# Patient Record
Sex: Female | Born: 1995
Health system: Southern US, Community
[De-identification: ages and names within clinical notes are randomized; demographics above are authoritative.]

## PROBLEM LIST (undated history)

## (undated) ENCOUNTER — Inpatient Hospital Stay (HOSPITAL_COMMUNITY): Payer: Self-pay

## (undated) DIAGNOSIS — K831 Obstruction of bile duct: Secondary | ICD-10-CM

## (undated) DIAGNOSIS — R51 Headache: Secondary | ICD-10-CM

## (undated) DIAGNOSIS — O26643 Intrahepatic cholestasis of pregnancy, third trimester: Secondary | ICD-10-CM

## (undated) DIAGNOSIS — R519 Headache, unspecified: Secondary | ICD-10-CM

## (undated) DIAGNOSIS — R569 Unspecified convulsions: Secondary | ICD-10-CM

## (undated) DIAGNOSIS — O26613 Liver and biliary tract disorders in pregnancy, third trimester: Secondary | ICD-10-CM

## (undated) DIAGNOSIS — D649 Anemia, unspecified: Secondary | ICD-10-CM

## (undated) HISTORY — DX: Unspecified convulsions: R56.9

## (undated) HISTORY — DX: Obstruction of bile duct: O26.613

## (undated) HISTORY — DX: Intrahepatic cholestasis of pregnancy, third trimester: O26.643

## (undated) HISTORY — DX: Obstruction of bile duct: K83.1

---
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
## 2014-02-15 ENCOUNTER — Encounter (HOSPITAL_COMMUNITY): Payer: Self-pay | Admitting: Emergency Medicine

## 2015-04-17 NOTE — L&D Delivery Note (Signed)
Pt precipitously delivered a viable female infant in bed. Fellow Genevie Ann arrived shortly afterwards, clamped and cut the cord and I arrived later and took over. Baby had vigorous cry and was on mothers abdomen. I obtained cord blood and spontaneously delivered placenta in shultz and with 3vc.  Vaginal inspection revealed abrasions only - no lacs; hemostatic. No repair needed.  Mother and baby stable; weight pending; Apgars of 9 and 9

## 2015-05-23 LAB — OB RESULTS CONSOLE HEPATITIS B SURFACE ANTIGEN: HEP B S AG: NEGATIVE

## 2015-05-23 LAB — OB RESULTS CONSOLE HIV ANTIBODY (ROUTINE TESTING): HIV: NONREACTIVE

## 2015-05-23 LAB — OB RESULTS CONSOLE RUBELLA ANTIBODY, IGM: RUBELLA: IMMUNE

## 2015-05-23 LAB — OB RESULTS CONSOLE ABO/RH: RH Type: POSITIVE

## 2015-05-23 LAB — OB RESULTS CONSOLE GC/CHLAMYDIA
CHLAMYDIA, DNA PROBE: NEGATIVE
GC PROBE AMP, GENITAL: NEGATIVE

## 2015-05-23 LAB — OB RESULTS CONSOLE ANTIBODY SCREEN: ANTIBODY SCREEN: NEGATIVE

## 2015-05-23 LAB — OB RESULTS CONSOLE RPR: RPR: NONREACTIVE

## 2015-05-31 ENCOUNTER — Inpatient Hospital Stay (HOSPITAL_COMMUNITY)
Admission: AD | Admit: 2015-05-31 | Payer: Medicaid Other | Source: Ambulatory Visit | Admitting: Obstetrics and Gynecology

## 2015-11-01 ENCOUNTER — Encounter (HOSPITAL_COMMUNITY): Payer: Self-pay | Admitting: *Deleted

## 2015-11-01 ENCOUNTER — Inpatient Hospital Stay (HOSPITAL_COMMUNITY)
Admission: AD | Admit: 2015-11-01 | Discharge: 2015-11-01 | Disposition: A | Discharge status: Home / Self Care | Payer: BLUE CROSS/BLUE SHIELD | Source: Ambulatory Visit | Attending: Obstetrics and Gynecology | Admitting: Obstetrics and Gynecology

## 2015-11-01 DIAGNOSIS — O26613 Liver and biliary tract disorders in pregnancy, third trimester: Secondary | ICD-10-CM | POA: Diagnosis not present

## 2015-11-01 DIAGNOSIS — K831 Obstruction of bile duct: Secondary | ICD-10-CM | POA: Diagnosis not present

## 2015-11-01 DIAGNOSIS — O26893 Other specified pregnancy related conditions, third trimester: Secondary | ICD-10-CM | POA: Diagnosis present

## 2015-11-01 DIAGNOSIS — Z3A34 34 weeks gestation of pregnancy: Secondary | ICD-10-CM | POA: Insufficient documentation

## 2015-11-01 DIAGNOSIS — O26643 Intrahepatic cholestasis of pregnancy, third trimester: Secondary | ICD-10-CM

## 2015-11-01 MED ORDER — BETAMETHASONE SOD PHOS & ACET 6 (3-3) MG/ML IJ SUSP
12.0000 mg | Freq: Once | INTRAMUSCULAR | Status: AC
  Administered 2015-11-01: 12 mg via INTRAMUSCULAR
  Filled 2015-11-01: qty 2

## 2015-11-01 NOTE — MAU Note (Signed)
Pt here for BMZ injection, Dr. Mindi SlickerBanga putting in order.

## 2015-11-02 ENCOUNTER — Inpatient Hospital Stay (HOSPITAL_COMMUNITY)
Admission: AD | Admit: 2015-11-02 | Discharge: 2015-11-02 | Disposition: A | Discharge status: Home / Self Care | Payer: BLUE CROSS/BLUE SHIELD | Source: Ambulatory Visit | Attending: Obstetrics and Gynecology | Admitting: Obstetrics and Gynecology

## 2015-11-02 DIAGNOSIS — Z3A00 Weeks of gestation of pregnancy not specified: Secondary | ICD-10-CM | POA: Insufficient documentation

## 2015-11-02 DIAGNOSIS — O26619 Liver and biliary tract disorders in pregnancy, unspecified trimester: Secondary | ICD-10-CM | POA: Diagnosis not present

## 2015-11-02 MED ORDER — BETAMETHASONE SOD PHOS & ACET 6 (3-3) MG/ML IJ SUSP
12.0000 mg | Freq: Once | INTRAMUSCULAR | Status: AC
  Administered 2015-11-02: 12 mg via INTRAMUSCULAR
  Filled 2015-11-02: qty 2

## 2015-11-02 NOTE — MAU Note (Signed)
Pt presents to MAU for repeat betamethasone injection. Denies any vaginal bleeding or LOF

## 2015-11-11 ENCOUNTER — Encounter (HOSPITAL_COMMUNITY): Payer: Self-pay | Admitting: *Deleted

## 2015-11-11 ENCOUNTER — Telehealth (HOSPITAL_COMMUNITY): Payer: Self-pay | Admitting: *Deleted

## 2015-11-11 NOTE — Telephone Encounter (Signed)
Preadmission screen  

## 2015-11-18 ENCOUNTER — Inpatient Hospital Stay (HOSPITAL_COMMUNITY)
Admission: RE | Admit: 2015-11-18 | Discharge: 2015-11-20 | DRG: 775 | Disposition: A | Discharge status: Home / Self Care | Payer: BLUE CROSS/BLUE SHIELD | Source: Ambulatory Visit | Attending: Obstetrics and Gynecology | Admitting: Obstetrics and Gynecology

## 2015-11-18 ENCOUNTER — Encounter (HOSPITAL_COMMUNITY): Payer: Self-pay

## 2015-11-18 DIAGNOSIS — O26619 Liver and biliary tract disorders in pregnancy, unspecified trimester: Secondary | ICD-10-CM

## 2015-11-18 DIAGNOSIS — O2662 Liver and biliary tract disorders in childbirth: Principal | ICD-10-CM | POA: Diagnosis present

## 2015-11-18 DIAGNOSIS — O26649 Intrahepatic cholestasis of pregnancy, unspecified trimester: Secondary | ICD-10-CM | POA: Diagnosis present

## 2015-11-18 DIAGNOSIS — O9902 Anemia complicating childbirth: Secondary | ICD-10-CM | POA: Diagnosis present

## 2015-11-18 DIAGNOSIS — Z3A36 36 weeks gestation of pregnancy: Secondary | ICD-10-CM | POA: Diagnosis not present

## 2015-11-18 DIAGNOSIS — K831 Obstruction of bile duct: Secondary | ICD-10-CM | POA: Diagnosis present

## 2015-11-18 DIAGNOSIS — Z8249 Family history of ischemic heart disease and other diseases of the circulatory system: Secondary | ICD-10-CM | POA: Diagnosis not present

## 2015-11-18 DIAGNOSIS — O365930 Maternal care for other known or suspected poor fetal growth, third trimester, not applicable or unspecified: Secondary | ICD-10-CM | POA: Diagnosis present

## 2015-11-18 DIAGNOSIS — D649 Anemia, unspecified: Secondary | ICD-10-CM | POA: Diagnosis present

## 2015-11-18 HISTORY — DX: Headache: R51

## 2015-11-18 HISTORY — DX: Anemia, unspecified: D64.9

## 2015-11-18 HISTORY — DX: Headache, unspecified: R51.9

## 2015-11-18 LAB — CBC
HEMATOCRIT: 29.4 % — AB (ref 36.0–46.0)
HEMOGLOBIN: 9 g/dL — AB (ref 12.0–15.0)
MCH: 22.1 pg — AB (ref 26.0–34.0)
MCHC: 30.6 g/dL (ref 30.0–36.0)
MCV: 72.2 fL — ABNORMAL LOW (ref 78.0–100.0)
Platelets: 228 10*3/uL (ref 150–400)
RBC: 4.07 MIL/uL (ref 3.87–5.11)
RDW: 17.1 % — ABNORMAL HIGH (ref 11.5–15.5)
WBC: 9.2 10*3/uL (ref 4.0–10.5)

## 2015-11-18 LAB — OB RESULTS CONSOLE GBS: STREP GROUP B AG: NEGATIVE

## 2015-11-18 LAB — TYPE AND SCREEN
ABO/RH(D): O POS
ANTIBODY SCREEN: NEGATIVE

## 2015-11-18 LAB — RPR: RPR Ser Ql: NONREACTIVE

## 2015-11-18 LAB — GROUP B STREP BY PCR: GROUP B STREP BY PCR: NEGATIVE

## 2015-11-18 MED ORDER — LIDOCAINE HCL (PF) 1 % IJ SOLN
30.0000 mL | INTRAMUSCULAR | Status: DC | PRN
  Filled 2015-11-18: qty 30

## 2015-11-18 MED ORDER — OXYTOCIN BOLUS FROM INFUSION
500.0000 mL | Freq: Once | INTRAVENOUS | Status: AC
  Administered 2015-11-19: 500 mL via INTRAVENOUS

## 2015-11-18 MED ORDER — PENICILLIN G POTASSIUM 5000000 UNITS IJ SOLR
5.0000 10*6.[IU] | Freq: Once | INTRAMUSCULAR | Status: AC
  Administered 2015-11-18: 5 10*6.[IU] via INTRAVENOUS
  Filled 2015-11-18: qty 5

## 2015-11-18 MED ORDER — OXYCODONE-ACETAMINOPHEN 5-325 MG PO TABS: 1.0000 | ORAL_TABLET | ORAL | Status: DC | PRN

## 2015-11-18 MED ORDER — ONDANSETRON HCL 4 MG/2ML IJ SOLN: 4.0000 mg | Freq: Four times a day (QID) | INTRAMUSCULAR | Status: DC | PRN

## 2015-11-18 MED ORDER — OXYTOCIN 40 UNITS IN LACTATED RINGERS INFUSION - SIMPLE MED: 2.5000 [IU]/h | INTRAVENOUS | Status: DC

## 2015-11-18 MED ORDER — TERBUTALINE SULFATE 1 MG/ML IJ SOLN
0.2500 mg | Freq: Once | INTRAMUSCULAR | Status: DC | PRN
  Filled 2015-11-18: qty 1

## 2015-11-18 MED ORDER — OXYCODONE-ACETAMINOPHEN 5-325 MG PO TABS: 2.0000 | ORAL_TABLET | ORAL | Status: DC | PRN

## 2015-11-18 MED ORDER — OXYTOCIN 40 UNITS IN LACTATED RINGERS INFUSION - SIMPLE MED
1.0000 m[IU]/min | INTRAVENOUS | Status: DC
Start: 2015-11-18 — End: 2015-11-19
  Administered 2015-11-18: 2 m[IU]/min via INTRAVENOUS
  Filled 2015-11-18: qty 1000

## 2015-11-18 MED ORDER — LACTATED RINGERS IV SOLN
INTRAVENOUS | Status: DC
  Administered 2015-11-18 (×2): via INTRAVENOUS

## 2015-11-18 MED ORDER — LACTATED RINGERS IV SOLN: 500.0000 mL | INTRAVENOUS | Status: DC | PRN

## 2015-11-18 MED ORDER — ACETAMINOPHEN 325 MG PO TABS: 650.0000 mg | ORAL_TABLET | ORAL | Status: DC | PRN

## 2015-11-18 MED ORDER — DEXTROSE 5 % IV SOLN
2.5000 10*6.[IU] | INTRAVENOUS | Status: DC
  Administered 2015-11-18: 2.5 10*6.[IU] via INTRAVENOUS
  Filled 2015-11-18 (×7): qty 2.5

## 2015-11-18 MED ORDER — BUTORPHANOL TARTRATE 1 MG/ML IJ SOLN
1.0000 mg | INTRAMUSCULAR | Status: DC
  Administered 2015-11-18 (×2): 1 mg via INTRAVENOUS
  Filled 2015-11-18 (×3): qty 1

## 2015-11-18 MED ORDER — SOD CITRATE-CITRIC ACID 500-334 MG/5ML PO SOLN: 30.0000 mL | ORAL | Status: DC | PRN

## 2015-11-18 NOTE — H&P (Addendum)
Brandy Sanders is a 20 y.o. female presenting for scheduled iol for cholestasis of pregnancy. Pt had this with last pregnancy as well.  Her prenatal course begun in first trimester with dating based on 10 week [place]. She has a remote hx of seizures - none during pregnancy; last episode was at age 27. Had fainting spells with last pregnancy - none with this. At [redacted] weeks gestation pt begun complaining of itching. Subsequently she was noted to have cholestasis of pregnancy. Pt has been managed with steroid course, twice weekly NSTs and serial labwork. She was started on ursodiol and dose increased to manageable limit. At last appt a week ago, baby was noted to be measuring small - [place] confirmed 14th percentile - mostly due to [facility] measurement in 2.3%.  . OB History    Gravida Para Term Preterm AB Living   0 0 1   SAB TAB Ectopic Multiple Live Births   0 0 0 0 1     Past Medical History:  Diagnosis Date  . Anemia    Takes Iron supplement  . Cholestasis of pregnancy in third trimester   . Headache    relief with Tylenol  . Seizures (HCC) 2008   Had seizure in 3rd month of first pregnancy in 2013   Past Surgical History:  Procedure Laterality Date  . NO PAST SURGERIES    . WISDOM TOOTH EXTRACTION  2014   Family History: family history includes Hypertension in her mother. Social History:  reports that she has never smoked. She has never used smokeless tobacco. She reports that she does not drink alcohol or use drugs.     Maternal Diabetes: No Genetic Screening: Normal Maternal Ultrasounds/Referrals: Normal SGA Fetal Ultrasounds or other Referrals:  None Maternal Substance Abuse:  No Significant Maternal Medications:  Meds include: Other: ursodiol Significant Maternal Lab Results:  Lab values include: Other: GBS pending Other Comments:  None  Review of Systems  Constitutional: Positive for malaise/fatigue. Negative for chills, fever and weight loss.  Eyes: Negative for blurred  vision.  Respiratory: Negative for shortness of breath.   Cardiovascular: Negative for chest pain and palpitations.  Gastrointestinal: Negative for abdominal pain, heartburn and nausea.  Genitourinary: Negative for dysuria.  Musculoskeletal: Negative for back pain.  Skin: Positive for itching and rash.  Neurological: Negative for headaches.  Psychiatric/Behavioral: Negative for depression, hallucinations, substance abuse and suicidal ideas. The patient is not nervous/anxious.    Maternal Medical History:  Reason for admission: Nausea. iol for cholestasis of pregnancy  Contractions: Frequency: rare.    Fetal activity: Perceived fetal activity is normal.   Last perceived fetal movement was within the past hour.    Prenatal complications: Cholestasis SGA  Prenatal Complications - Diabetes: none.    Dilation: 2 Effacement (%): 70, 80 Station: -2 Exam by:: Brandy Slipper, RN Blood pressure 112/68, pulse 74, temperature 97.6 F (36.4 C), temperature source Oral, resp. rate 20, height 5' (1.524 m), weight 147 lb (66.7 kg). Maternal Exam:  Uterine Assessment: Contraction strength is mild.  Contraction frequency is irregular.   Abdomen: Patient reports generalized tenderness.  Estimated fetal weight is 5lbs 1oz as of last week per [place].   Fetal presentation: vertex  Introitus: Normal vulva. Normal vagina.  Pelvis: adequate for delivery.   Cervix: Cervix evaluated by digital exam.     Physical Exam  Constitutional: She is oriented to person, place, and time. She appears well-developed and well-nourished.  Neck: Normal range of motion.  Cardiovascular: Normal rate.   Respiratory: Effort normal.  GI: Soft. There is generalized tenderness.  Genitourinary: Vagina normal and uterus normal.  Musculoskeletal: Normal range of motion. She exhibits no edema.  Neurological: She is alert and oriented to person, place, and time.  Skin: Skin is warm. Rash noted.  Psychiatric: She has a normal  mood and affect. Her behavior is normal. Judgment and thought content normal.    Prenatal labs: ABO, Rh: --/--/O POS (08/04 2355) Antibody: NEG (08/04 7322) Rubella: Immune (02/06 0000) RPR: Nonreactive (02/06 0000)  HBsAg: Negative (02/06 0000)  HIV: Non-reactive (02/06 0000)  GBS:     Assessment/Plan: G2P1001 at 22 3/[redacted]wks gestation with cholestasis of pregnancy for IOL Pitocin per protocol AROM after first dose of PCN; GBS result not found ( was done in office) - rapid result pending Pain control prn Anticipate svd   Brandy Sanders 11/18/2015, 9:12 AM

## 2015-11-18 NOTE — Progress Notes (Signed)
Patient ID: Brandy Sanders, female   DOB: Dec 05, 1995, 20 y.o.   MRN: [id number] Pt on of pitocin - rates contractions at 5/10. No complaints. +fms; Clear fluid VSS EFM- 145, +accels, -decels, moderate variability, cat 1 TOCO - contractions q 1-9mins SVE 4/85/-2  A/P: G2P1001 at 36 3/7wks progressing in labor         Continue with expectant management         Anticipate svd

## 2015-11-18 NOTE — Anesthesia Pain Management Evaluation Note (Signed)
  CRNA Pain Management Visit Note  Patient: Brandy Sanders, 20 y.o., female  "Hello I am a member of the anesthesia team at [facility]. We have an anesthesia team available at all times to provide care throughout the hospital, including epidural management and anesthesia for C-section. I don't know your plan for the delivery whether it a natural birth, water birth, IV sedation, nitrous supplementation, doula or epidural, but we want to meet your pain goals."   1.Was your pain managed to your expectations on prior hospitalizations?   Yes   2.What is your expectation for pain management during this hospitalization?     Labor support without medications  3.How can we help you reach that goal? labor support  Record the patient's initial score and the patient's pain goal.   Pain: 0  Pain Goal: 8 [facility] wants you to be able to say your pain was always managed very well.  Salome Arnt 11/18/2015

## 2015-11-18 NOTE — Progress Notes (Signed)
Patient ID: Brandy Sanders, female   DOB: Aug 24, 1995, 20 y.o.   MRN: [id number] Pt still rates contractions at 5/10 VSS EFM 135, +accels, -decels, moderate variability, cat 1 TOCO contractions irregular - walking monitor SVE 4.5/90/-2   A/P: Continue with expectant management         Anticipate svd

## 2015-11-18 NOTE — Progress Notes (Signed)
Patient ID: Brandy Sanders, female   DOB: 11-17-1995, 20 y.o.   MRN: [id number] Attempted AROM Will try again with lower station Pt doing well - plans to have pain free delivery  VSS EFM - 140, +accels, -decels, moderate variability, cat 1 TOCO - irreg ctxs q 3-31mins SVE - 1.5/70/-2  A/P: Pit at - continue per protocol         S/p 1 dose PCN for GBS prophylaxis         AROM when able         Anticipate svd

## 2015-11-18 NOTE — Progress Notes (Signed)
Patient ID: Brandy Sanders, female   DOB: 1995-05-29, 20 y.o.   MRN: [id number] Pt reports increasing pain with contractions. Appears AROM was successful - clear fluid return shortly after attempt earlier was noted. _Fms VSS EFM - 140s, +accels, -decels, moderate variability; cat 1 TOCO - contractions q 3-75mins SVE 3/75/-2, ant  A/P:G2P1001 with cholestasis at 36 3/7wks progressing in labor          Pit at - continue per protocol         Anticipate svd

## 2015-11-19 ENCOUNTER — Encounter (HOSPITAL_COMMUNITY): Payer: Self-pay | Admitting: Obstetrics and Gynecology

## 2015-11-19 LAB — CBC
HEMATOCRIT: 24.7 % — AB (ref 36.0–46.0)
Hemoglobin: 7.8 g/dL — ABNORMAL LOW (ref 12.0–15.0)
MCH: 22.7 pg — AB (ref 26.0–34.0)
MCHC: 31.6 g/dL (ref 30.0–36.0)
MCV: 71.8 fL — AB (ref 78.0–100.0)
PLATELETS: 177 10*3/uL (ref 150–400)
RBC: 3.44 MIL/uL — ABNORMAL LOW (ref 3.87–5.11)
RDW: 17.3 % — AB (ref 11.5–15.5)
WBC: 11.1 10*3/uL — AB (ref 4.0–10.5)

## 2015-11-19 MED ORDER — ONDANSETRON HCL 4 MG/2ML IJ SOLN: 4.0000 mg | INTRAMUSCULAR | Status: DC | PRN

## 2015-11-19 MED ORDER — SIMETHICONE 80 MG PO CHEW
80.0000 mg | CHEWABLE_TABLET | ORAL | Status: DC | PRN
Start: 2015-11-19 — End: 2015-11-20

## 2015-11-19 MED ORDER — ZOLPIDEM TARTRATE 5 MG PO TABS
5.0000 mg | ORAL_TABLET | Freq: Every evening | ORAL | Status: DC | PRN
Start: 2015-11-19 — End: 2015-11-20

## 2015-11-19 MED ORDER — SENNOSIDES-DOCUSATE SODIUM 8.6-50 MG PO TABS
2.0000 | ORAL_TABLET | ORAL | Status: DC
  Administered 2015-11-19: 2 via ORAL
  Filled 2015-11-19: qty 2

## 2015-11-19 MED ORDER — TETANUS-DIPHTH-ACELL PERTUSSIS 5-2.5-18.5 LF-MCG/0.5 IM SUSP: 0.5000 mL | Freq: Once | INTRAMUSCULAR | Status: DC

## 2015-11-19 MED ORDER — WITCH HAZEL-GLYCERIN EX PADS: 1.0000 "application " | MEDICATED_PAD | CUTANEOUS | Status: DC | PRN

## 2015-11-19 MED ORDER — ACETAMINOPHEN 325 MG PO TABS: 650.0000 mg | ORAL_TABLET | ORAL | Status: DC | PRN

## 2015-11-19 MED ORDER — COCONUT OIL OIL: 1.0000 "application " | TOPICAL_OIL | Status: DC | PRN

## 2015-11-19 MED ORDER — DIPHENHYDRAMINE HCL 25 MG PO CAPS: 25.0000 mg | ORAL_CAPSULE | Freq: Four times a day (QID) | ORAL | Status: DC | PRN

## 2015-11-19 MED ORDER — DIBUCAINE 1 % RE OINT: 1.0000 "application " | TOPICAL_OINTMENT | RECTAL | Status: DC | PRN

## 2015-11-19 MED ORDER — ONDANSETRON HCL 4 MG PO TABS: 4.0000 mg | ORAL_TABLET | ORAL | Status: DC | PRN

## 2015-11-19 MED ORDER — FERROUS SULFATE 325 (65 FE) MG PO TABS
325.0000 mg | ORAL_TABLET | Freq: Two times a day (BID) | ORAL | Status: DC
  Administered 2015-11-19: 325 mg via ORAL
  Filled 2015-11-19: qty 1

## 2015-11-19 MED ORDER — BENZOCAINE-MENTHOL 20-0.5 % EX AERO: 1.0000 "application " | INHALATION_SPRAY | CUTANEOUS | Status: DC | PRN

## 2015-11-19 MED ORDER — IBUPROFEN 600 MG PO TABS
600.0000 mg | ORAL_TABLET | Freq: Four times a day (QID) | ORAL | Status: DC
  Administered 2015-11-19 – 2015-11-20 (×5): 600 mg via ORAL
  Filled 2015-11-19 (×6): qty 1

## 2015-11-19 MED ORDER — PRENATAL MULTIVITAMIN CH
1.0000 | ORAL_TABLET | Freq: Every day | ORAL | Status: DC
  Administered 2015-11-19: 1 via ORAL
  Filled 2015-11-19: qty 1

## 2015-11-19 NOTE — Lactation Note (Addendum)
This note was copied from a baby's chart. Lactation Consultation Note Initial consult with this 5 pound LPI, now 12 hours old. The baby has been feeding every 2 hours,. Mom puts baby to breast first, and then he has been tolerating 15 ml's of formula, by bottle ,is pumping at least every 3 hours, to protect her milk supply and provide EBm for Chrissie Noa. Mom was decreased to 21 flanges with a good fit. Mom had some colostrum expressed as soon as pumping began. Mom says she has an electric pump at home. She will ask her family member if it is a double pump. LPI protocol reviewed with mom. Mom keeping baby skin to skin, and doing very well with this baby. Mom's first was also a LPI. Mom knows to call for questions/concerns.   Patient Name: Boy Allure Igoe EHMCN'O Date: 11/19/2015 Reason for consult: Initial assessment   Maternal Data Formula Feeding for Exclusion: Yes (infant 5 pounds and LPI, will need formula for supplemetn initially) Has patient been taught Hand Expression?: Yes Does the patient have breastfeeding experience prior to this delivery?: Yes  Feeding Feeding Type: Breast Fed  LATCH Score/Interventions Latch: Too sleepy or reluctant, no latch achieved, no sucking elicited.  Audible Swallowing: None (easily expressed colostrum)  Type of Nipple: Everted at rest and after stimulation  Comfort (Breast/Nipple): Soft / non-tender     Hold (Positioning): Assistance needed to correctly position infant at breast and maintain latch. Intervention(s): Breastfeeding basics reviewed;Support Pillows;Position options;Skin to skin  LATCH Score: 5  Lactation Tools Discussed/Used Tools: Pump Breast pump type: Double-Electric Breast Pump Pump Review: Setup, frequency, and cleaning;Milk Storage;Other (comment) (hand expression pump setting) Initiated by:: bedside RN Date initiated:: 11/19/15   Consult Status Consult Status: Follow-up Date: 11/20/15 Follow-up type:  In-patient    Alfred Levins 11/19/2015, 1:09 PM

## 2015-11-19 NOTE — Progress Notes (Signed)
Called to bedside for delivery. When I arrived baby had been delivered. Cord was clamped x2 and cut. Dr. Mindi Slicker arrived and completed with delivery.   Ernestina Penna MD

## 2015-11-20 MED ORDER — IBUPROFEN 600 MG PO TABS: 600.0000 mg | ORAL_TABLET | Freq: Four times a day (QID) | ORAL | 0 refills | Status: DC

## 2015-11-20 MED ORDER — FERROUS SULFATE 325 (65 FE) MG PO TABS: 325.0000 mg | ORAL_TABLET | Freq: Two times a day (BID) | ORAL | 2 refills | Status: DC

## 2015-11-20 NOTE — Progress Notes (Signed)
Patient ID: Brandy Sanders, female   DOB: 11/19/1995, 20 y.o.   MRN: [id number] Pt doing well. Sore but tolerable. Lochia mild. Ambulating and tolerating diet well. No problems with voids. Baby latched well this am. Requests discharge to home today VSS ABD- soft, ND EXT - no homans, no edema  A/ P: [id number] female on PPD#1 s/p svd; had cholestasis  - itching improved          Reviewed discharge instructions- rx for ibuprofen and iron          Does not desire bc or circumcision          F/u in 6weeks

## 2015-11-20 NOTE — Discharge Instructions (Signed)
Nothing in vagina for 6 weeks.  No sex, tampons, and douching.  Other instructions as in [facility] Discharge Booklet. °

## 2015-11-20 NOTE — Discharge Summary (Signed)
OB Discharge Summary     Patient Name: Brandy MorganJasmine Edberg DOB: 02/22/1996 MRN: [id number]  Date of admission: 11/18/2015 Delivering MD: Pryor OchoaBANGA, Melinda [facility]   Date of discharge: 11/20/2015  Admitting diagnosis: INDUCTION Intrauterine pregnancy: Unknown     Secondary diagnosis:  Active Problems:   Cholestasis of pregnancy   SVD (spontaneous vaginal delivery)   Postpartum care following vaginal delivery  Additional problems: anemia     Discharge diagnosis: Preterm Pregnancy Delivered                                                                                                Post partum procedures:none  Augmentation: AROM and Pitocin  Complications: None  Hospital course:  Induction of Labor With Vaginal Delivery   20 y.o. yo [id number] at Unknown was admitted to the hospital 11/18/2015 for induction of labor.  Indication for induction: Cholestasis of pregnancy.  Patient had an uncomplicated labor course as follows: Membrane Rupture Time/Date: 10:36 AM ,11/18/2015   Intrapartum Procedures: Episiotomy: None [1]                                         Lacerations:  None [1]  Patient had delivery of a Viable infant.  Information for the patient's newborn:  Haskell RilingMedelEnriquez, Boy Viviene [[id number]][[id number]]  Delivery Method: Vag-Spont   11/19/2015  Details of delivery can be found in separate delivery note.  Patient had a routine postpartum course. Patient is discharged home 11/20/15.   Physical exam Vitals:   11/19/15 0415 11/19/15 0815 11/19/15 1800 11/20/15 0554  BP: 104/65 (!) 103/57 (!) 104/51 90/77  Pulse: 73 74 89 85  Resp: 20 18 19 16   Temp: 98.3 F (36.8 C) 98.9 F (37.2 C) 98.8 F (37.1 C) 98 F (36.7 C)  TempSrc:      Weight:      Height:       General: alert, cooperative and no distress Lochia: appropriate Uterine Fundus: firm Incision: N/A DVT Evaluation: No evidence of DVT seen on physical exam. No significant calf/ankle edema. Labs: Lab Results  Component Value  Date   WBC 11.1 (H) 11/19/2015   HGB 7.8 (L) 11/19/2015   HCT 24.7 (L) 11/19/2015   MCV 71.8 (L) 11/19/2015   PLT 177 11/19/2015   CMP Latest Ref Rng & Units 02/04/2013  Glucose 70 - 99 mg/dL 70  BUN 6 - 23 mg/dL 9  Creatinine 3.240.50 - 4.011.10 mg/dL 0.270.80  Sodium 253135 - 664145 mEq/L 143  Potassium 3.5 - 5.1 mEq/L 3.5  Chloride 96 - 112 mEq/L 108  CO2 19 - 32 mEq/L -  Calcium 8.4 - 10.5 mg/dL -  Total Protein 6.0 - 8.3 g/dL -  Total Bilirubin 0.3 - 1.2 mg/dL -  Alkaline Phos 47 - 403119 U/L -  AST 0 - 37 U/L -  ALT 0 - 35 U/L -    Discharge instruction: per After Visit Summary and "Baby and Me Booklet".  After visit meds:    Medication List  STOP taking these medications   ursodiol 300 MG capsule Commonly known as:  ACTIGALL     TAKE these medications   ferrous sulfate 325 (65 FE) MG tablet Take 1 tablet (325 mg total) by mouth 2 (two) times daily with a meal.   ibuprofen 600 MG tablet Commonly known as:  ADVIL,MOTRIN Take 1 tablet (600 mg total) by mouth every 6 (six) hours.       Diet: routine diet  Activity: Advance as tolerated. Pelvic rest for 6 weeks.   Outpatient follow up:6 weeks Follow up Appt:No future appointments. Follow up Visit:No Follow-up on file.  Postpartum contraception: Natural Family Planning  Newborn Data: Live born female  Birth Weight: 5 lb 2.4 oz (2336 g) APGAR: 9, 9  Baby Feeding: Bottle and Breast Disposition:home with mother   11/20/2015 Sharol Given Doniven Vanpatten, DO

## 2015-11-20 NOTE — Lactation Note (Signed)
This note was copied from a baby's chart. Lactation Consultation Note Follow up consult with this mom and LPI, now 34 hours old and 36 5/7 weeks CGam weight 5 lbs 0.4 oz. Mom has been breast feeding and supplementing with formula, but not pumping and hand expressing. Mom has easily expressed colostrum. I advised her to add pumping and HE to protect her milk supply and increase the amount of EBM the baby gets by supplementing with EBM prior to formula. Mom has a DEP at home. She will call WIC to get 22 cal formula. I gave mom some Neosure formula to take home, and reviewed storage guidelines with her . Mom  Knows to call for questions/concerns.  Patient Name: Brandy Sanders ZOXWR'UToday's Date: 11/20/2015 Reason for consult: Follow-up assessment   Maternal Data    Feeding Feeding Type: Bottle Fed - Formula Nipple Type: Slow - flow Length of feed: 15 min  LATCH Score/Interventions    Audible Swallowing:  (easily expressed colostrum)                 Lactation Tools Discussed/Used     Consult Status Consult Status: Complete Follow-up type: Call as needed    Alfred LevinsLee, Deontrey Massi Anne 11/20/2015, 11:24 AM

## 2016-09-16 ENCOUNTER — Emergency Department (HOSPITAL_BASED_OUTPATIENT_CLINIC_OR_DEPARTMENT_OTHER)
Admission: EM | Admit: 2016-09-16 | Discharge: 2016-09-16 | Disposition: A | Discharge status: Home / Self Care | Payer: BLUE CROSS/BLUE SHIELD | Attending: Emergency Medicine | Admitting: Emergency Medicine

## 2016-09-16 ENCOUNTER — Emergency Department (HOSPITAL_COMMUNITY)
Admission: EM | Admit: 2016-09-16 | Discharge: 2016-09-16 | Discharge status: Absent without leave | Payer: BLUE CROSS/BLUE SHIELD

## 2016-09-16 ENCOUNTER — Encounter (HOSPITAL_BASED_OUTPATIENT_CLINIC_OR_DEPARTMENT_OTHER): Payer: Self-pay | Admitting: *Deleted

## 2016-09-16 DIAGNOSIS — B373 Candidiasis of vulva and vagina: Secondary | ICD-10-CM | POA: Insufficient documentation

## 2016-09-16 DIAGNOSIS — B3731 Acute candidiasis of vulva and vagina: Secondary | ICD-10-CM

## 2016-09-16 LAB — URINALYSIS, ROUTINE W REFLEX MICROSCOPIC
BILIRUBIN URINE: NEGATIVE
GLUCOSE, UA: NEGATIVE mg/dL
HGB URINE DIPSTICK: NEGATIVE
KETONES UR: NEGATIVE mg/dL
Nitrite: NEGATIVE
PH: 6.5 (ref 5.0–8.0)
Protein, ur: NEGATIVE mg/dL
Specific Gravity, Urine: 1.011 (ref 1.005–1.030)

## 2016-09-16 LAB — WET PREP, GENITAL
Clue Cells Wet Prep HPF POC: NONE SEEN
Sperm: NONE SEEN
TRICH WET PREP: NONE SEEN

## 2016-09-16 LAB — PREGNANCY, URINE: Preg Test, Ur: NEGATIVE

## 2016-09-16 LAB — URINALYSIS, MICROSCOPIC (REFLEX)

## 2016-09-16 MED ORDER — FLUCONAZOLE 50 MG PO TABS
150.0000 mg | ORAL_TABLET | Freq: Once | ORAL | Status: AC
  Administered 2016-09-16: 06:00:00 150 mg via ORAL
  Filled 2016-09-16: qty 1

## 2016-09-16 NOTE — ED Provider Notes (Signed)
[facility] Provider Note: [name], MD, FACEP  CSN: [id number] MRN: [id number] ARRIVAL: 09/16/16 at 0448 ROOM: MH07/MH07   CHIEF COMPLAINT  Vaginal Discharge   HISTORY OF PRESENT ILLNESS  Brandy Sanders is a 21 y.o. female who developed vaginal itching yesterday with associated white, curd-like discharge. She took an over-the-counter dose of Monistat. She is here this morning because overnight she developed vulvovaginal burning. She cannot quantify the burning. She is also having some mild uterine cramping. She is not having any vaginal bleeding. She is denies dysuria.   Past Medical History:  Diagnosis Date  . Anemia    Takes Iron supplement  . Cholestasis of pregnancy in third trimester   . Headache    relief with Tylenol  . Seizures (HCC) 2008   Had seizure in 3rd month of first pregnancy in 2013    Past Surgical History:  Procedure Laterality Date  . NO PAST SURGERIES    . WISDOM TOOTH EXTRACTION  2014    Family History  Problem Relation Age of Onset  . Hypertension Mother     Social History  Substance Use Topics  . Smoking status: Never Smoker  . Smokeless tobacco: Never Used  . Alcohol use No    Prior to Admission medications   Not on File    Allergies Patient has no known allergies.   REVIEW OF SYSTEMS  Negative except as noted here or in the History of Present Illness.   PHYSICAL EXAMINATION  Initial Vital Signs Blood pressure 123/80, pulse (!) 102, temperature 99 F (37.2 C), temperature source Oral, resp. rate 16, height 5\' 3"  (1.6 m), weight 63.5 kg (140 lb), last menstrual period 09/02/2016, SpO2 100 %, unknown if currently breastfeeding.  Examination General: Well-developed, well-nourished female in no acute distress; appearance consistent with age of record HENT: normocephalic; atraumatic Eyes: pupils equal, round and reactive to light; extraocular muscles intact Neck: supple Heart: regular rate and  rhythm Lungs: clear to auscultation bilaterally Abdomen: soft; nondistended; mild suprapubic tenderness; no masses or hepatosplenomegaly; bowel sounds present GU: Vulvovaginal inflammation with white, curd-like discharge; no vaginal bleeding; mild crampy uterine tenderness Extremities: No deformity; full range of motion; pulses normal Neurologic: Awake, alert and oriented; motor function intact in all extremities and symmetric; no facial droop Skin: Warm and dry Psychiatric: Normal mood and affect   RESULTS  Summary of this visit's results, reviewed by myself:   EKG Interpretation  Date/Time:    Ventricular Rate:    PR Interval:    QRS Duration:   QT Interval:    QTC Calculation:   R Axis:     Text Interpretation:        Laboratory Studies: Results for orders placed or performed during the hospital encounter of 09/16/16 (from the past 24 hour(s))  Urinalysis, Routine w reflex microscopic     Status: Abnormal   Collection Time: 09/16/16  4:54 AM  Result Value Ref Range   Color, Urine YELLOW YELLOW   APPearance TURBID (A) CLEAR   Specific Gravity, Urine 1.011 1.005 - 1.030   pH 6.5 5.0 - 8.0   Glucose, UA NEGATIVE NEGATIVE mg/dL   Hgb urine dipstick NEGATIVE NEGATIVE   Bilirubin Urine NEGATIVE NEGATIVE   Ketones, ur NEGATIVE NEGATIVE mg/dL   Protein, ur NEGATIVE NEGATIVE mg/dL   Nitrite NEGATIVE NEGATIVE   Leukocytes, UA LARGE (A) NEGATIVE  Pregnancy, urine     Status: None   Collection Time: 09/16/16  4:54 AM  Result Value Ref  Range   Preg Test, Ur NEGATIVE NEGATIVE  Urinalysis, Microscopic (reflex)     Status: Abnormal   Collection Time: 09/16/16  4:54 AM  Result Value Ref Range   RBC / HPF 0-5 0 - 5 RBC/hpf   WBC, UA 6-30 0 - 5 WBC/hpf   Bacteria, UA MANY (A) NONE SEEN   Squamous Epithelial / LPF TOO NUMEROUS TO COUNT (A) NONE SEEN  Wet prep, genital     Status: Abnormal   Collection Time: 09/16/16  5:25 AM  Result Value Ref Range   Yeast Wet Prep HPF POC  PRESENT (A) NONE SEEN   Trich, Wet Prep NONE SEEN NONE SEEN   Clue Cells Wet Prep HPF POC NONE SEEN NONE SEEN   WBC, Wet Prep HPF POC MANY (A) NONE SEEN   Sperm NONE SEEN    Imaging Studies: No results found.  ED COURSE  Nursing notes and initial vitals signs, including pulse oximetry, reviewed.  Vitals:   09/16/16 0453  BP: 123/80  Pulse: (!) 102  Resp: 16  Temp: 99 F (37.2 C)  TempSrc: Oral  SpO2: 100%  Weight: 63.5 kg (140 lb)  Height: 5\' 3"  (1.6 m)    PROCEDURES    ED DIAGNOSES     ICD-9-CM ICD-10-CM   1. Vaginal yeast infection 112.1 B37.3        Beauty Pless, MD 09/16/16 [phone]

## 2016-09-16 NOTE — ED Triage Notes (Signed)
Pt c/o vaginal burning that started yesterday. States that she has also had a "white discharge" states that she bought and OTC monistat and took a dose yesterday. Also c/o itching to vaginal area. Denies any urinary symptoms. Also concerned about an STD.

## 2016-09-17 LAB — GC/CHLAMYDIA PROBE AMP (~~LOC~~) NOT AT ARMC
CHLAMYDIA, DNA PROBE: NEGATIVE
Neisseria Gonorrhea: NEGATIVE

## 2016-10-31 ENCOUNTER — Emergency Department (HOSPITAL_BASED_OUTPATIENT_CLINIC_OR_DEPARTMENT_OTHER)
Admission: EM | Admit: 2016-10-31 | Discharge: 2016-10-31 | Disposition: A | Discharge status: Home / Self Care | Payer: Self-pay | Attending: Emergency Medicine | Admitting: Emergency Medicine

## 2016-10-31 ENCOUNTER — Encounter (HOSPITAL_BASED_OUTPATIENT_CLINIC_OR_DEPARTMENT_OTHER): Payer: Self-pay

## 2016-10-31 DIAGNOSIS — R21 Rash and other nonspecific skin eruption: Secondary | ICD-10-CM | POA: Insufficient documentation

## 2016-10-31 MED ORDER — HYDROCORTISONE 2.5 % EX LOTN: TOPICAL_LOTION | Freq: Two times a day (BID) | CUTANEOUS | 0 refills | Status: DC

## 2016-10-31 MED ORDER — TRIAMCINOLONE ACETONIDE 0.1 % EX CREA: 1.0000 "application " | TOPICAL_CREAM | Freq: Two times a day (BID) | CUTANEOUS | 0 refills | Status: DC

## 2016-10-31 NOTE — ED Notes (Signed)
Pt verbalizes understanding of d/c instructions and denies any further needs at this time. 

## 2016-10-31 NOTE — ED Triage Notes (Signed)
C/o rash to chest and neck x 1 month-NAD-steady gait

## 2016-10-31 NOTE — ED Provider Notes (Signed)
[facility] Provider Note   CSN: [id number] Arrival date & time: 10/31/16  2108   By signing my name below, I, [name], attest that this documentation has been prepared under the direction and in the presence of Verdie Mosher, Neysa Bonito, MD. Electronically signed, [name], ED Scribe. 10/31/16. 10:25 PM.  History   Chief Complaint Chief Complaint  Patient presents with  . Rash   The history is provided by the patient and medical records. No language interpreter was used.    Brandy Sanders is a 21 y.o. female presenting to the Emergency Department concerning small, raised, itchy areas of skin to the chest that have spread to her neck x 1 month. No h/o similar symptoms noted. She states eczema may run on her grandmother's side of the family. No new products, food or outdoor exposures. No pain fever No PCP d/t lack of insurance.  Past Medical History:  Diagnosis Date  . Anemia    Takes Iron supplement  . Cholestasis of pregnancy in third trimester   . Headache    relief with Tylenol  . Seizures (HCC) 2008   Had seizure in 3rd month of first pregnancy in 2013    Patient Active Problem List   Diagnosis Date Noted  . SVD (spontaneous vaginal delivery) 11/19/2015  . Postpartum care following vaginal delivery 11/19/2015  . Cholestasis of pregnancy 11/18/2015  . Cholestasis of pregnancy in third trimester 11/01/2015  . Routine postpartum follow-up 05/12/2012  . Intrahepatic cholestasis of pregnancy, antepartum 03/30/2012  . Supervision of high-risk pregnancy 10/04/2011  . Seizure disorder (HCC) 10/02/2011  . High risk teen pregnancy 10/02/2011  . GBS (group B streptococcus) infection 10/02/2011    Past Surgical History:  Procedure Laterality Date  . NO PAST SURGERIES    . WISDOM TOOTH EXTRACTION  2014    OB History    Gravida Para Term Preterm AB Living   2 2 1 1  0 2   SAB TAB Ectopic Multiple Live Births   0 0 0 0 2       Home Medications     Prior to Admission medications   Medication Sig Start Date End Date Taking? Authorizing Provider  hydrocortisone 2.5 % lotion Apply topically 2 (two) times daily. For face 10/31/16   Lavera Guise, MD  triamcinolone cream (KENALOG) 0.1 % Apply 1 application topically 2 (two) times daily. Over chest and torso 10/31/16   Lavera Guise, MD    Family History Family History  Problem Relation Age of Onset  . Hypertension Mother     Social History Social History  Substance Use Topics  . Smoking status: Never Smoker  . Smokeless tobacco: Never Used  . Alcohol use No     Allergies   Patient has no known allergies.   Review of Systems Review of Systems  Constitutional: Negative for fever.  Respiratory: Negative for shortness of breath.   Cardiovascular: Negative for chest pain.  Gastrointestinal: Negative for abdominal pain.  Skin: Positive for rash. Negative for color change and wound.  All other systems reviewed and are negative.    Physical Exam Updated Vital Signs BP 101/63 (BP Location: Left Arm)   Pulse 95   Temp 98.8 F (37.1 C) (Oral)   Resp 17   Ht 5\' 3"  (1.6 m)   Wt 140 lb (63.5 kg)   LMP 09/23/2016   SpO2 100%   BMI 24.80 kg/m   Physical Exam  Constitutional: She is oriented to person, place,  and time. She appears well-developed and well-nourished.  HENT:  Head: Normocephalic.  Eyes: EOM are normal.  Neck: Normal range of motion.  Pulmonary/Chest: Effort normal.  Abdominal: She exhibits no distension.  Musculoskeletal: Normal range of motion.  Neurological: She is alert and oriented to person, place, and time.  Skin:  Flat, macular rash over the anterior chest wall and neck with mild scaling. No erythema, warmth or tenderness.  Psychiatric: She has a normal mood and affect.  Nursing note and vitals reviewed.    ED Treatments / Results  DIAGNOSTIC STUDIES: Oxygen Saturation is 100% on RA, NL by my interpretation.    COORDINATION OF CARE: 10:23  PM-Discussed next steps with pt. Pt verbalized understanding and is agreeable with the plan. Will Rx steroid.   Labs (all labs ordered are listed, but only abnormal results are displayed) Labs Reviewed - No data to display  EKG  EKG Interpretation None       Radiology No results found.  Procedures Procedures (including critical care time)  Medications Ordered in ED Medications - No data to display   Initial Impression / Assessment and Plan / ED Course  I have reviewed the triage vital signs and the nursing notes.  Pertinent labs & imaging results that were available during my care of the patient were reviewed by me and considered in my medical decision making (see chart for details).     Presenting with rash over her chest for several weeks. Appears to be eczema like rash, which did threaten her family. No new products, but states that she is heavy fragrances of body wash. Prescribed a steroid cream. Discussed discontinuation of heavy fragrances. Strict return and follow-up instructions reviewed. She expressed understanding of all discharge instructions and felt comfortable with the plan of care.   Final Clinical Impressions(s) / ED Diagnoses   Final diagnoses:  Rash    New Prescriptions New Prescriptions   HYDROCORTISONE 2.5 % LOTION    Apply topically 2 (two) times daily. For face   TRIAMCINOLONE CREAM (KENALOG) 0.1 %    Apply 1 application topically 2 (two) times daily. Over chest and torso   I personally performed the services described in this documentation, which was scribed in my presence. The recorded information has been reviewed and is accurate.    Lavera GuiseLiu, Tracie Lindbloom Duo, MD 10/31/16 [phone]

## 2016-10-31 NOTE — Discharge Instructions (Signed)
You appear to have an eczema type rash. Avoid any fragrances. Use hydrocortisone on your face, but use triamcinolone on your chest Return for worsening symptoms, including fever, increased redness/swelling or any other symptoms concerning ot you.

## 2017-01-23 ENCOUNTER — Encounter (HOSPITAL_BASED_OUTPATIENT_CLINIC_OR_DEPARTMENT_OTHER): Payer: Self-pay

## 2017-01-23 ENCOUNTER — Emergency Department (HOSPITAL_BASED_OUTPATIENT_CLINIC_OR_DEPARTMENT_OTHER)
Admission: EM | Admit: 2017-01-23 | Discharge: 2017-01-23 | Disposition: A | Discharge status: Home / Self Care | Payer: Self-pay | Attending: Physician Assistant | Admitting: Physician Assistant

## 2017-01-23 DIAGNOSIS — D509 Iron deficiency anemia, unspecified: Secondary | ICD-10-CM | POA: Insufficient documentation

## 2017-01-23 LAB — CBC WITH DIFFERENTIAL/PLATELET
BASOS PCT: 1 %
Basophils Absolute: 0.1 10*3/uL (ref 0.0–0.1)
EOS ABS: 0.2 10*3/uL (ref 0.0–0.7)
Eosinophils Relative: 2 %
HCT: 25.9 % — ABNORMAL LOW (ref 36.0–46.0)
Hemoglobin: 7.2 g/dL — ABNORMAL LOW (ref 12.0–15.0)
LYMPHS ABS: 2.6 10*3/uL (ref 0.7–4.0)
Lymphocytes Relative: 31 %
MCH: 17.4 pg — AB (ref 26.0–34.0)
MCHC: 27.8 g/dL — AB (ref 30.0–36.0)
MCV: 62.6 fL — ABNORMAL LOW (ref 78.0–100.0)
MONO ABS: 0.7 10*3/uL (ref 0.1–1.0)
Monocytes Relative: 8 %
NEUTROS ABS: 4.7 10*3/uL (ref 1.7–7.7)
NEUTROS PCT: 58 %
PLATELETS: 413 10*3/uL — AB (ref 150–400)
RBC: 4.14 MIL/uL (ref 3.87–5.11)
RDW: 18.9 % — AB (ref 11.5–15.5)
WBC: 8.3 10*3/uL (ref 4.0–10.5)

## 2017-01-23 LAB — URINALYSIS, ROUTINE W REFLEX MICROSCOPIC
BILIRUBIN URINE: NEGATIVE
Glucose, UA: NEGATIVE mg/dL
Ketones, ur: NEGATIVE mg/dL
Leukocytes, UA: NEGATIVE
Nitrite: NEGATIVE
PROTEIN: NEGATIVE mg/dL
Specific Gravity, Urine: <1.005 — ABNORMAL LOW (ref 1.005–1.030)
pH: 6.5 (ref 5.0–8.0)

## 2017-01-23 LAB — URINALYSIS, MICROSCOPIC (REFLEX)

## 2017-01-23 LAB — COMPREHENSIVE METABOLIC PANEL
ALK PHOS: 77 U/L (ref 38–126)
ALT: 18 U/L (ref 14–54)
AST: 20 U/L (ref 15–41)
Albumin: 4.6 g/dL (ref 3.5–5.0)
Anion gap: 7 (ref 5–15)
BUN: 10 mg/dL (ref 6–20)
CALCIUM: 9.7 mg/dL (ref 8.9–10.3)
CO2: 26 mmol/L (ref 22–32)
CREATININE: 0.53 mg/dL (ref 0.44–1.00)
Chloride: 106 mmol/L (ref 101–111)
GFR calc non Af Amer: >60 mL/min (ref >60)
GLUCOSE: 96 mg/dL (ref 65–99)
Potassium: 3.9 mmol/L (ref 3.5–5.1)
SODIUM: 139 mmol/L (ref 135–145)
Total Bilirubin: 0.3 mg/dL (ref 0.3–1.2)
Total Protein: 8.2 g/dL — ABNORMAL HIGH (ref 6.5–8.1)

## 2017-01-23 LAB — PREGNANCY, URINE: PREG TEST UR: NEGATIVE

## 2017-01-23 MED ORDER — FERROUS SULFATE 325 (65 FE) MG PO TABS: 325.0000 mg | ORAL_TABLET | Freq: Every day | ORAL | 0 refills | Status: DC

## 2017-01-23 NOTE — ED Provider Notes (Addendum)
[facility]   CSN: [id number] Arrival date & time: 01/23/17  1813     History   Chief Complaint Chief Complaint  Patient presents with  . Headache    HPI Brandy Sanders is a 21 y.o. female.  HPI   21 year old female G1 P1 presenting with feelings of fatigue. Patient reports that she feels more fatigued than usual every little thing she does exhaust her. Patient also reports mild nausea. She reports that her last period was irregular. Her last true period was 3 months ago and then she had a lighter one 3 weeks ago. Patient denies taking any kind pregnancy test prior to arrival. Denies any nausea vomiting stomach pain, vaginal pain or discharge.  Of note the nursing note says she had smelly vaginal discharge, she reports that she had this 6 weeks ago but at since resolved, she has no vaginal complaints this time.   Past Medical History:  Diagnosis Date  . Anemia    Takes Iron supplement  . Cholestasis of pregnancy in third trimester   . Headache    relief with Tylenol  . Seizures (HCC) 2008   Had seizure in 3rd month of first pregnancy in 2013    Patient Active Problem List   Diagnosis Date Noted  . SVD (spontaneous vaginal delivery) 11/19/2015  . Postpartum care following vaginal delivery 11/19/2015  . Cholestasis of pregnancy 11/18/2015  . Cholestasis of pregnancy in third trimester 11/01/2015  . Routine postpartum follow-up 05/12/2012  . Intrahepatic cholestasis of pregnancy, antepartum 03/30/2012  . Supervision of high-risk pregnancy 10/04/2011  . Seizure disorder (HCC) 10/02/2011  . High risk teen pregnancy 10/02/2011  . GBS (group B streptococcus) infection 10/02/2011    Past Surgical History:  Procedure Laterality Date  . NO PAST SURGERIES    . WISDOM TOOTH EXTRACTION  2014    OB History    Gravida Para Term Preterm AB Living   0 2   SAB TAB Ectopic Multiple Live Births   0 0 0 0 2       Home Medications     Prior to Admission medications   Not on File    Family History Family History  Problem Relation Age of Onset  . Hypertension Mother     Social History Social History  Substance Use Topics  . Smoking status: Never Smoker  . Smokeless tobacco: Never Used  . Alcohol use Yes     Comment: occ     Allergies   Patient has no known allergies.   Review of Systems Review of Systems  Constitutional: Positive for fatigue. Negative for activity change and fever.  HENT: Negative for congestion.   Respiratory: Negative for shortness of breath.   Cardiovascular: Negative for chest pain.  Gastrointestinal: Negative for abdominal pain, nausea and vomiting.  All other systems reviewed and are negative.    Physical Exam Updated Vital Signs BP 115/79 (BP Location: Left Arm)   Pulse 98   Temp 98.3 F (36.8 C) (Oral)   Resp 18   Ht  (1.575 m)   Wt 59.4 kg (131 lb)   LMP 01/09/2017   SpO2 100%   BMI 23.96 kg/m   Physical Exam  Constitutional: She is oriented to person, place, and time. She appears well-developed and well-nourished.  HENT:  Head: Normocephalic and atraumatic.  Eyes: Right eye exhibits no discharge. Left eye exhibits no discharge.  Cardiovascular: Normal rate and regular rhythm.   Pulmonary/Chest:  Effort normal and breath sounds normal. No respiratory distress. She has no wheezes.  Abdominal: Soft. Bowel sounds are normal. She exhibits no distension. There is no tenderness.  Neurological: She is oriented to person, place, and time.  Skin: Skin is warm and dry. She is not diaphoretic.  Psychiatric: She has a normal mood and affect.  Nursing note and vitals reviewed.    ED Treatments / Results  Labs (all labs ordered are listed, but only abnormal results are displayed) Labs Reviewed  COMPREHENSIVE METABOLIC PANEL  CBC WITH DIFFERENTIAL/PLATELET    EKG  EKG Interpretation None       Radiology No results found.  Procedures Procedures  (including critical care time)  Medications Ordered in ED Medications - No data to display   Initial Impression / Assessment and Plan / ED Course  I have reviewed the triage vital signs and the nursing notes.  Pertinent labs & imaging results that were available during my care of the patient were reviewed by me and considered in my medical decision making (see chart for details).     Patient is a healthy 79 year oldfe female presenting with vague noncystic cysts nonspecific feelings of fatigue. Patient does have history of anemia. She is not taking iron pills currently. We'll get labs urine pregnancy test.  She;s had no abnormal bleeding, no black stools.   7:58 PM Hemogram 7.2. Not greatly different than her last hemoglobin. Patient has history of iron deficiency anemia and is supposed to be on iron pills. We will re-prescribe her iron pills,encourage take iron rich diet and follow-up with primary care physician for retesting.  Final Clinical Impressions(s) / ED Diagnoses   Final diagnoses:  None    New Prescriptions New Prescriptions   No medications on file     Abelino Derrick, MD 01/23/17 1959    Abelino Derrick, MD 01/23/17 [phone]

## 2017-01-23 NOTE — ED Triage Notes (Addendum)
C/o HA, nausea, no appetite-fatigue x 2 weeks-also c/o vaginal itching, "foul smell" however denies d/c-NAD-steady gait

## 2017-01-23 NOTE — Discharge Instructions (Signed)
We think you have anemia due to iron deficiency like you did previously. Please take plenty of iron pills and eat and iron rich diet. Please follow-up in the next week to get your blood rechecked.

## 2017-04-04 ENCOUNTER — Emergency Department (HOSPITAL_BASED_OUTPATIENT_CLINIC_OR_DEPARTMENT_OTHER): Payer: Self-pay

## 2017-04-04 ENCOUNTER — Encounter (HOSPITAL_BASED_OUTPATIENT_CLINIC_OR_DEPARTMENT_OTHER): Payer: Self-pay

## 2017-04-04 ENCOUNTER — Emergency Department (HOSPITAL_COMMUNITY): Payer: Self-pay

## 2017-04-04 ENCOUNTER — Other Ambulatory Visit: Payer: Self-pay

## 2017-04-04 ENCOUNTER — Emergency Department (HOSPITAL_BASED_OUTPATIENT_CLINIC_OR_DEPARTMENT_OTHER)
Admission: EM | Admit: 2017-04-04 | Discharge: 2017-04-04 | Disposition: A | Discharge status: Home / Self Care | Payer: Self-pay | Attending: Emergency Medicine | Admitting: Emergency Medicine

## 2017-04-04 DIAGNOSIS — Z3A01 Less than 8 weeks gestation of pregnancy: Secondary | ICD-10-CM | POA: Insufficient documentation

## 2017-04-04 DIAGNOSIS — O26891 Other specified pregnancy related conditions, first trimester: Secondary | ICD-10-CM | POA: Insufficient documentation

## 2017-04-04 DIAGNOSIS — R197 Diarrhea, unspecified: Secondary | ICD-10-CM

## 2017-04-04 DIAGNOSIS — R109 Unspecified abdominal pain: Secondary | ICD-10-CM

## 2017-04-04 DIAGNOSIS — R55 Syncope and collapse: Secondary | ICD-10-CM

## 2017-04-04 DIAGNOSIS — O9989 Other specified diseases and conditions complicating pregnancy, childbirth and the puerperium: Secondary | ICD-10-CM | POA: Insufficient documentation

## 2017-04-04 DIAGNOSIS — E860 Dehydration: Secondary | ICD-10-CM

## 2017-04-04 DIAGNOSIS — R8271 Bacteriuria: Secondary | ICD-10-CM | POA: Insufficient documentation

## 2017-04-04 DIAGNOSIS — R112 Nausea with vomiting, unspecified: Secondary | ICD-10-CM

## 2017-04-04 DIAGNOSIS — O99281 Endocrine, nutritional and metabolic diseases complicating pregnancy, first trimester: Secondary | ICD-10-CM | POA: Insufficient documentation

## 2017-04-04 DIAGNOSIS — O219 Vomiting of pregnancy, unspecified: Secondary | ICD-10-CM | POA: Insufficient documentation

## 2017-04-04 LAB — URINALYSIS, MICROSCOPIC (REFLEX)

## 2017-04-04 LAB — URINALYSIS, ROUTINE W REFLEX MICROSCOPIC
BILIRUBIN URINE: NEGATIVE
Glucose, UA: NEGATIVE mg/dL
HGB URINE DIPSTICK: NEGATIVE
KETONES UR: 15 mg/dL — AB
Leukocytes, UA: NEGATIVE
NITRITE: NEGATIVE
Protein, ur: 100 mg/dL — AB
SPECIFIC GRAVITY, URINE: 1.02 (ref 1.005–1.030)
pH: 8.5 — ABNORMAL HIGH (ref 5.0–8.0)

## 2017-04-04 LAB — COMPREHENSIVE METABOLIC PANEL
ALBUMIN: 4.5 g/dL (ref 3.5–5.0)
ALK PHOS: 76 U/L (ref 38–126)
ALT: 19 U/L (ref 14–54)
AST: 19 U/L (ref 15–41)
Anion gap: 7 (ref 5–15)
BILIRUBIN TOTAL: 0.4 mg/dL (ref 0.3–1.2)
BUN: 9 mg/dL (ref 6–20)
CALCIUM: 9.2 mg/dL (ref 8.9–10.3)
CO2: 23 mmol/L (ref 22–32)
CREATININE: 0.47 mg/dL (ref 0.44–1.00)
Chloride: 105 mmol/L (ref 101–111)
GFR calc Af Amer: >60 mL/min (ref >60)
GFR calc non Af Amer: >60 mL/min (ref >60)
GLUCOSE: 106 mg/dL — AB (ref 65–99)
Potassium: 3.2 mmol/L — ABNORMAL LOW (ref 3.5–5.1)
SODIUM: 135 mmol/L (ref 135–145)
TOTAL PROTEIN: 8.4 g/dL — AB (ref 6.5–8.1)

## 2017-04-04 LAB — PREGNANCY, URINE: PREG TEST UR: POSITIVE — AB

## 2017-04-04 LAB — CBC WITH DIFFERENTIAL/PLATELET
BASOS ABS: 0 10*3/uL (ref 0.0–0.1)
BASOS PCT: 0 %
EOS ABS: 0.1 10*3/uL (ref 0.0–0.7)
Eosinophils Relative: 1 %
HEMATOCRIT: 36.8 % (ref 36.0–46.0)
HEMOGLOBIN: 11.5 g/dL — AB (ref 12.0–15.0)
LYMPHS PCT: 15 %
Lymphs Abs: 1.4 10*3/uL (ref 0.7–4.0)
MCH: 22.8 pg — AB (ref 26.0–34.0)
MCHC: 31.3 g/dL (ref 30.0–36.0)
MCV: 73 fL — ABNORMAL LOW (ref 78.0–100.0)
MONOS PCT: 5 %
Monocytes Absolute: 0.5 10*3/uL (ref 0.1–1.0)
NEUTROS ABS: 7.4 10*3/uL (ref 1.7–7.7)
NEUTROS PCT: 79 %
Platelets: 251 10*3/uL (ref 150–400)
RBC: 5.04 MIL/uL (ref 3.87–5.11)
RDW: 21.1 % — ABNORMAL HIGH (ref 11.5–15.5)
WBC: 9.4 10*3/uL (ref 4.0–10.5)

## 2017-04-04 LAB — WET PREP, GENITAL
Clue Cells Wet Prep HPF POC: NONE SEEN
SPERM: NONE SEEN
Trich, Wet Prep: NONE SEEN
Yeast Wet Prep HPF POC: NONE SEEN

## 2017-04-04 LAB — LIPASE, BLOOD: Lipase: 21 U/L (ref 11–51)

## 2017-04-04 LAB — HCG, QUANTITATIVE, PREGNANCY: hCG, Beta Chain, Quant, S: 48203 m[IU]/mL — ABNORMAL HIGH (ref <5)

## 2017-04-04 MED ORDER — SODIUM CHLORIDE 0.9 % IV BOLUS (SEPSIS)
2000.0000 mL | Freq: Once | INTRAVENOUS | Status: AC
  Administered 2017-04-04: 2000 mL via INTRAVENOUS

## 2017-04-04 MED ORDER — CEPHALEXIN 500 MG PO CAPS: 500.0000 mg | ORAL_CAPSULE | Freq: Two times a day (BID) | ORAL | 0 refills | Status: DC

## 2017-04-04 MED ORDER — DEXTROSE 5 % IV SOLN
1.0000 g | Freq: Once | INTRAVENOUS | Status: AC
  Administered 2017-04-04: 1 g via INTRAVENOUS
  Filled 2017-04-04: qty 10

## 2017-04-04 MED ORDER — PRENATAL COMPLETE 14-0.4 MG PO TABS: 1.0000 | ORAL_TABLET | Freq: Every day | ORAL | 1 refills | Status: DC

## 2017-04-04 MED FILL — CEPHALEXIN 500 MG CAPSULE: 500 | 10 days supply | Qty: 20 | Fill #0

## 2017-04-04 MED FILL — PRENATAL VITAMIN PLUS LOW I: 27-1 | 60 days supply | Qty: 60 | Fill #0

## 2017-04-04 NOTE — Discharge Instructions (Signed)
Follow with OB/GYN as soon as possible.   Buy Doxylamine and vitamin B6  Doxylamine - Most commonly available as Unisom. READ THE LABEL. You must get doxylamine and nothing else. Typically available in 25mg  tablets. Vitamin B6 - Try to find a small dose. The smallest I could find were 50mg  tablets.  First thing in the morning  Dose 1: 12.5mg  Doxylamine & 12.5mg  B6  Lunchtime  Dose 2: 12.5mg  Doxylamine & 12.5mg  B6  Before dinner  Dose 3: 12.5mg  Doxylamine & 12.5mg  B6  Bedtime  Dose 4: 25mg  Doxylamine & 25mg  B6  You will need to take these at regularly spaced intervals (I've had good luck with every six hours) and continuously to see results. I felt better in about three days (drastically better). You can take up to 75mg  Doxylamine and 200mg  B6 daily. So if you need more, go ahead and adjust as necessary.   Do NOT take any NSAIDs, such as Aspirin, Motrin, Ibuprofen, Aleve, Naproxen etc. Only take Tylenol for pain. Return to the emergency room  for any severe abdominal pain, increasing vaginal bleeding, passing out or repeated vomiting.   Obtain over-the-counter prenatal vitamins. Read the label and make sure that they have at least 400 mcg of folate acid.  Please [facility] the [facility] [place] [address] to apply for coverage. Alternatively, you can could go to the DSS office [place] to apply for emergency coverage.

## 2017-04-04 NOTE — ED Triage Notes (Signed)
C/o vomiting x 3 days-states she "passed out" while seated in car approx car-was dropped off my family-pt LMP 10/30-pos home preg test-NAD-steady gait

## 2017-04-04 NOTE — ED Notes (Signed)
ED Provider at bedside. 

## 2017-04-04 NOTE — ED Provider Notes (Signed)
[facility] EMERGENCY DEPARTMENT Provider Note   CSN: [id number] Arrival date & time: 04/04/17  1205     History   Chief Complaint Chief Complaint  Patient presents with  . Emesis     HPI   Blood pressure 99/67, pulse 97, temperature 98.4 F (36.9 C), temperature source Oral, resp. rate 18, height 5\' 2"  (1.575 m), weight 59 kg (130 lb), last menstrual period 02/12/2017, SpO2 100 %, unknown if currently breastfeeding.  Brandy Sanders is a 20 y.o. female complaining of multiple episodes of nonbloody, nonbilious, non-coffee-ground emesis with mild diarrhea over the last several weeks, she had an episode of syncope today with a prodrome of lightheadedness and double vision.  She has severe lower abdominal pain cannot localize.  Her last menstrual period was on October 30 she had a positive home pregnancy test 3 weeks ago, she is G3 P2.  No sick contacts, fevers, chills, abnormal vaginal discharge.  She has not seen an OB/GYN for this pregnancy.  There is been no vaginal spotting or bleeding.  She has a history of anemia with iron supplementation but has never required transfusion.   Past Medical History:  Diagnosis Date  . Anemia    Takes Iron supplement  . Cholestasis of pregnancy in third trimester   . Headache    relief with Tylenol  . Seizures (HCC) 2008   Had seizure in 3rd month of first pregnancy in 2013    Patient Active Problem List   Diagnosis Date Noted  . SVD (spontaneous vaginal delivery) 11/19/2015  . Postpartum care following vaginal delivery 11/19/2015  . Cholestasis of pregnancy 11/18/2015  . Cholestasis of pregnancy in third trimester 11/01/2015  . Routine postpartum follow-up 05/12/2012  . Intrahepatic cholestasis of pregnancy, antepartum 03/30/2012  . Supervision of high-risk pregnancy 10/04/2011  . Seizure disorder (HCC) 10/02/2011  . High risk teen pregnancy 10/02/2011  . GBS (group B streptococcus) infection 10/02/2011    Past  Surgical History:  Procedure Laterality Date  . NO PAST SURGERIES    . WISDOM TOOTH EXTRACTION  2014    OB History    Gravida Para Term Preterm AB Living   2 2 1 1  0 2   SAB TAB Ectopic Multiple Live Births   0 0 0 0 2       Home Medications    Prior to Admission medications   Medication Sig Start Date End Date Taking? Authorizing Provider  cephALEXin (KEFLEX) 500 MG capsule Take 1 capsule (500 mg total) by mouth 2 (two) times daily. 04/04/17   Iden Stripling, Joni Reining, PA-C  Prenatal Vit-Fe Fumarate-FA (PRENATAL COMPLETE) 14-0.4 MG TABS Take 1 tablet by mouth daily. 04/04/17   Sotero Brinkmeyer, Joni Reining, PA-C    Family History Family History  Problem Relation Age of Onset  . Hypertension Mother     Social History Social History   Tobacco Use  . Smoking status: Never Smoker  . Smokeless tobacco: Never Used  Substance Use Topics  . Alcohol use: Yes    Comment: occ  . Drug use: No     Allergies   Patient has no known allergies.   Review of Systems Review of Systems  A complete review of systems was obtained and all systems are negative except as noted in the HPI and PMH.   Physical Exam Updated Vital Signs BP (!) 92/58 (BP Location: Right Arm)   Pulse 79   Temp 98.4 F (36.9 C) (Oral)   Resp 18   Ht 5\' 2"  (  1.575 m)   Wt 59 kg (130 lb)   LMP 02/12/2017   SpO2 100%   BMI 23.78 kg/m   Physical Exam  Constitutional: She is oriented to person, place, and time. She appears well-developed and well-nourished. No distress.  HENT:  Head: Normocephalic and atraumatic.  Mouth/Throat: Oropharynx is clear and moist.  Eyes: Conjunctivae and EOM are normal. Pupils are equal, round, and reactive to light.  Neck: Normal range of motion. No JVD present. No tracheal deviation present.  Cardiovascular: Normal rate, regular rhythm and intact distal pulses.  Radial pulse equal bilaterally  Pulmonary/Chest: Effort normal and breath sounds normal. No stridor. No respiratory distress.  She has no wheezes. She has no rales. She exhibits no tenderness.  Abdominal: Soft. She exhibits no distension and no mass. There is no tenderness. There is no rebound and no guarding.  Genitourinary:  Genitourinary Comments: Pelvic exam chaperoned by Technician: No rashes or lesions, no abnormal vaginal discharge, no cervical motion or adnexal tenderness.  Musculoskeletal: Normal range of motion. She exhibits no edema or tenderness.  No calf asymmetry, superficial collaterals, palpable cords, edema, Homans sign negative bilaterally.    Neurological: She is alert and oriented to person, place, and time.  Skin: Skin is warm. She is not diaphoretic.  Psychiatric: She has a normal mood and affect.  Nursing note and vitals reviewed.    ED Treatments / Results  Labs (all labs ordered are listed, but only abnormal results are displayed) Labs Reviewed  WET PREP, GENITAL - Abnormal; Notable for the following components:      Result Value   WBC, Wet Prep HPF POC FEW (*)    All other components within normal limits  PREGNANCY, URINE - Abnormal; Notable for the following components:   Preg Test, Ur POSITIVE (*)    All other components within normal limits  URINALYSIS, ROUTINE W REFLEX MICROSCOPIC - Abnormal; Notable for the following components:   pH 8.5 (*)    Ketones, ur 15 (*)    Protein, ur 100 (*)    All other components within normal limits  URINALYSIS, MICROSCOPIC (REFLEX) - Abnormal; Notable for the following components:   Bacteria, UA MANY (*)    Squamous Epithelial / LPF 0-5 (*)    All other components within normal limits  HCG, QUANTITATIVE, PREGNANCY - Abnormal; Notable for the following components:   hCG, Beta Chain, Quant, S 48,203 (*)    All other components within normal limits  CBC WITH DIFFERENTIAL/PLATELET - Abnormal; Notable for the following components:   Hemoglobin 11.5 (*)    MCV 73.0 (*)    MCH 22.8 (*)    RDW 21.1 (*)    All other components within normal  limits  COMPREHENSIVE METABOLIC PANEL - Abnormal; Notable for the following components:   Potassium 3.2 (*)    Glucose, Bld 106 (*)    Total Protein 8.4 (*)    All other components within normal limits  URINE CULTURE  LIPASE, BLOOD  GC/CHLAMYDIA PROBE AMP ([place]) NOT AT [facility]    EKG  EKG Interpretation  Date/Time:  Thursday April 04 2017 15:00:28 EST Ventricular Rate:  79 PR Interval:    QRS Duration: 90 QT Interval:  384 QTC Calculation: 441 R Axis:   75 Text Interpretation:  Sinus rhythm Confirmed by Rolland PorterJames, Mark ([id number]) on 04/04/2017 4:02:55 PM       Radiology [place] Ob Less Than 14 Weeks With Ob Transvaginal  Result Date: 04/04/2017 CLINICAL DATA:  Pregnant patient  with nausea and vomiting. EXAM: OBSTETRIC <14 WK [place] AND TRANSVAGINAL OB US TECHNIQUE: Both transabdominal and transvaginal ultrasound examinations were performed for complete evaluation of the gestation as well as the maternal uterus, adnexal regions, and pelvic cul-de-sac. Transvaginal technique was performed to assess early pregnancy. COMPARISON:  None. FINDINGS: Intrauterine gestational sac: Single Yolk sac:  Visualized. Embryo:  Visualized. Cardiac Activity: Visualized. Heart Rate: 126  bpm CRL:  5.2  mm   6 w   2 d                  [place] EDC: 11/26/2017 Subchorionic hemorrhage:  Small Maternal uterus/adnexae: Probable right ovarian corpus luteum. Normal left ovary. Small to moderate free fluid in the pelvis. IMPRESSION: Single live intrauterine gestation.  Small subchorionic hemorrhage. Electronically Signed   By: Annia Belt M.D.   On: 04/04/2017 14:56    Procedures Procedures (including critical care time)  Medications Ordered in ED Medications  sodium chloride 0.9 % bolus 2,000 mL (0 mLs Intravenous Stopped 04/04/17 1625)  cefTRIAXone (ROCEPHIN) 1 g in dextrose 5 % 50 mL IVPB (0 g Intravenous Stopped 04/04/17 1625)     Initial Impression / Assessment and Plan / ED Course  I have reviewed the triage  vital signs and the nursing notes.  Pertinent labs & imaging results that were available during my care of the patient were reviewed by me and considered in my medical decision making (see chart for details).     Vitals:   04/04/17 1225 04/04/17 1500 04/04/17 1622  BP: 99/67 (!) 95/58 (!) 92/58  Pulse: 97 84 79  Resp: 18 14 18   Temp: 98.4 F (36.9 C)    TempSrc: Oral    SpO2: 100% 100% 100%  Weight: 59 kg (130 lb)    Height: 5\' 2"  (1.575 m)      Medications  sodium chloride 0.9 % bolus 2,000 mL (0 mLs Intravenous Stopped 04/04/17 1625)  cefTRIAXone (ROCEPHIN) 1 g in dextrose 5 % 50 mL IVPB (0 g Intravenous Stopped 04/04/17 1625)    Brandy Sanders is 21 y.o. female presenting with nausea vomiting diarrhea and syncopal events, she had a positive pregnancy test 3 weeks ago.  Abdominal exam is benign.  She is not having any vaginal discharge or vaginal bleeding, would consider ectopic in this patient with mild abdominal pain but syncope.  I think it is a possibility but very unlikely.  EKG with no arrhythmia or significant abnormality, vital signs reassuring.  With significant bacteriuria, no leukocytosis, pelvic exam reassuring.  Ultrasound with IUP, tolerating p.o.'s in the ED, repeat abdominal exam is benign, she feels much better after hydration.  I have advised her how to obtain emergency Medicaid, given her instructions on how to take diclegis.   Evaluation does not show pathology that would require ongoing emergent intervention or inpatient treatment. Pt is hemodynamically stable and mentating appropriately. Discussed findings and plan with patient/guardian, who agrees with care plan. All questions answered. Return precautions discussed and outpatient follow up given.      Final Clinical Impressions(s) / ED Diagnoses   Final diagnoses:  Dehydration  Less than [redacted] weeks gestation of pregnancy  Nausea vomiting and diarrhea  Nausea/vomiting in pregnancy    ED Discharge  Orders        Ordered    cephALEXin (KEFLEX) 500 MG capsule  2 times daily     04/04/17 1608    Prenatal Vit-Fe Fumarate-FA (PRENATAL COMPLETE) 14-0.4 MG TABS  Daily  04/04/17 1608       Brandy Sanders, Mardella Laymanicole, PA-C 04/04/17 1634    Tilden Fossaees, Elizabeth, MD 04/04/17 [phone]

## 2017-04-05 LAB — URINE CULTURE: Culture: NO GROWTH

## 2017-04-05 LAB — GC/CHLAMYDIA PROBE AMP (~~LOC~~) NOT AT ARMC
Chlamydia: NEGATIVE
Neisseria Gonorrhea: NEGATIVE

## 2017-11-10 ENCOUNTER — Other Ambulatory Visit: Payer: Self-pay

## 2017-11-10 ENCOUNTER — Inpatient Hospital Stay (HOSPITAL_COMMUNITY)
Admission: AD | Admit: 2017-11-10 | Discharge: 2017-11-10 | Disposition: A | Discharge status: Home / Self Care | Payer: Medicaid Other | Source: Ambulatory Visit | Attending: Obstetrics & Gynecology | Admitting: Obstetrics & Gynecology

## 2017-11-10 ENCOUNTER — Inpatient Hospital Stay (HOSPITAL_COMMUNITY): Payer: Medicaid Other

## 2017-11-10 ENCOUNTER — Encounter (HOSPITAL_COMMUNITY): Payer: Self-pay

## 2017-11-10 DIAGNOSIS — Z3A01 Less than 8 weeks gestation of pregnancy: Secondary | ICD-10-CM

## 2017-11-10 DIAGNOSIS — Z3A08 8 weeks gestation of pregnancy: Secondary | ICD-10-CM | POA: Insufficient documentation

## 2017-11-10 DIAGNOSIS — O26891 Other specified pregnancy related conditions, first trimester: Secondary | ICD-10-CM | POA: Insufficient documentation

## 2017-11-10 DIAGNOSIS — O26851 Spotting complicating pregnancy, first trimester: Secondary | ICD-10-CM | POA: Insufficient documentation

## 2017-11-10 DIAGNOSIS — O219 Vomiting of pregnancy, unspecified: Secondary | ICD-10-CM

## 2017-11-10 DIAGNOSIS — M545 Low back pain: Secondary | ICD-10-CM | POA: Diagnosis present

## 2017-11-10 DIAGNOSIS — R112 Nausea with vomiting, unspecified: Secondary | ICD-10-CM | POA: Diagnosis not present

## 2017-11-10 DIAGNOSIS — O26899 Other specified pregnancy related conditions, unspecified trimester: Secondary | ICD-10-CM

## 2017-11-10 DIAGNOSIS — O9989 Other specified diseases and conditions complicating pregnancy, childbirth and the puerperium: Secondary | ICD-10-CM

## 2017-11-10 DIAGNOSIS — M549 Dorsalgia, unspecified: Secondary | ICD-10-CM

## 2017-11-10 DIAGNOSIS — R109 Unspecified abdominal pain: Secondary | ICD-10-CM

## 2017-11-10 LAB — WET PREP, GENITAL
Clue Cells Wet Prep HPF POC: NONE SEEN
SPERM: NONE SEEN
Trich, Wet Prep: NONE SEEN
YEAST WET PREP: NONE SEEN

## 2017-11-10 LAB — CBC WITH DIFFERENTIAL/PLATELET
BASOS PCT: 0 %
Basophils Absolute: 0 10*3/uL (ref 0.0–0.1)
Eosinophils Absolute: 0.3 10*3/uL (ref 0.0–0.7)
Eosinophils Relative: 3 %
HEMATOCRIT: 36 % (ref 36.0–46.0)
Hemoglobin: 11.3 g/dL — ABNORMAL LOW (ref 12.0–15.0)
Lymphocytes Relative: 24 %
Lymphs Abs: 2.8 10*3/uL (ref 0.7–4.0)
MCH: 23.1 pg — ABNORMAL LOW (ref 26.0–34.0)
MCHC: 31.4 g/dL (ref 30.0–36.0)
MCV: 73.5 fL — ABNORMAL LOW (ref 78.0–100.0)
Monocytes Absolute: 0.4 10*3/uL (ref 0.1–1.0)
Monocytes Relative: 3 %
NEUTROS ABS: 8.1 10*3/uL — AB (ref 1.7–7.7)
Neutrophils Relative %: 70 %
Platelets: 284 10*3/uL (ref 150–400)
RBC: 4.9 MIL/uL (ref 3.87–5.11)
RDW: 19 % — ABNORMAL HIGH (ref 11.5–15.5)
WBC: 11.6 10*3/uL — ABNORMAL HIGH (ref 4.0–10.5)

## 2017-11-10 LAB — URINALYSIS, ROUTINE W REFLEX MICROSCOPIC
Bacteria, UA: NONE SEEN
Bilirubin Urine: NEGATIVE
GLUCOSE, UA: NEGATIVE mg/dL
Hgb urine dipstick: NEGATIVE
KETONES UR: NEGATIVE mg/dL
NITRITE: NEGATIVE
PROTEIN: NEGATIVE mg/dL
Specific Gravity, Urine: 1.031 — ABNORMAL HIGH (ref 1.005–1.030)
pH: 6 (ref 5.0–8.0)

## 2017-11-10 LAB — POCT PREGNANCY, URINE: Preg Test, Ur: POSITIVE — AB

## 2017-11-10 LAB — HCG, QUANTITATIVE, PREGNANCY: hCG, Beta Chain, Quant, S: 178103 m[IU]/mL — ABNORMAL HIGH (ref <5)

## 2017-11-10 MED ORDER — ACETAMINOPHEN 500 MG PO TABS
1000.0000 mg | ORAL_TABLET | Freq: Once | ORAL | Status: AC
  Administered 2017-11-10: 1000 mg via ORAL
  Filled 2017-11-10: qty 2

## 2017-11-10 MED ORDER — PROMETHAZINE HCL 12.5 MG PO TABS: 12.5000 mg | ORAL_TABLET | Freq: Four times a day (QID) | ORAL | 0 refills | Status: DC | PRN

## 2017-11-10 NOTE — Discharge Instructions (Signed)
Eating Plan for Nausea and Vomiting in Pregnancy °Hyperemesis gravidarum is a severe form of morning sickness. Because this condition causes severe nausea and vomiting, it can lead to dehydration, malnutrition, and weight loss. One way to lessen the symptoms of nausea and vomiting is to follow the eating plan for hyperemesis gravidarum. It is often used along with prescribed medicines to control your symptoms. °What can I do to relieve my symptoms? °Listen to your body. Everyone is different and has different preferences. Find what works best for you. Take any of the following actions that are helpful to you: °· Eat and drink slowly. °· Eat 5-6 small meals daily instead of 3 large meals. °· Eat crackers before you get out of bed in the morning. °· Try having a snack in the middle of the night. °· Starchy foods are usually tolerated well. Examples include cereal, toast, bread, potatoes, pasta, rice, and pretzels. °· Ginger may help with nausea. Add ¼ tsp ground ginger to hot tea or choose ginger tea. °· Try drinking 100% fruit juice or an electrolyte drink. An electrolyte drink contains sodium, potassium, and chloride. °· Continue to take your prenatal vitamins as told by your health care provider. If you are having trouble taking your prenatal vitamins, talk with your health care provider about different options. °· Include at least 1 serving of protein with your meals and snacks. Protein options include meats or poultry, beans, nuts, eggs, and yogurt. Try eating a protein-rich snack before bed. Examples of these snacks include cheese and crackers or half of a peanut butter or turkey sandwich. °· Consider eliminating foods that trigger your symptoms. These may include spicy foods, coffee, high-fat foods, very sweet foods, and acidic foods. °· Try meals that have more protein combined with bland, salty, lower-fat, and dry foods, such as nuts, seeds, pretzels, crackers, and cereal. °· Talk with your healthcare  provider about starting a supplement of vitamin B6. °· Have fluids that are cold, clear, and carbonated or sour. Examples include lemonade, ginger ale, lemon-lime soda, ice water, and sparkling water. °· Try lemon or mint tea. °· Try brushing your teeth or using a mouth rinse after meals. ° °What should I avoid to reduce my symptoms? °Avoiding some of the following things may help reduce your symptoms. °· Foods with strong smells. Try eating meals in well-ventilated areas that are free of odors. °· Drinking water or other beverages with meals. Try not to drink anything during the 30 minutes before and after your meals. °· Drinking more than 1 cup of fluid at a time. Sometimes using a straw helps. °· Fried or high-fat foods, such as butter and cream sauces. °· Spicy foods. °· Skipping meals as best as you can. Nausea can be more intense on an empty stomach. If you cannot tolerate food at that time, do not force it. Try sucking on ice chips or other frozen items, and make up for missed calories later. °· Lying down within 2 hours after eating. °· Environmental triggers. These may include smoky rooms, closed spaces, rooms with strong smells, warm or humid places, overly loud and noisy rooms, and rooms with motion or flickering lights. °· Quick and sudden changes in your movement. ° °This information is not intended to replace advice given to you by your health care provider. Make sure you discuss any questions you have with your health care provider. °Document Released: 01/28/2007 Document Revised: 11/30/2015 Document Reviewed: 11/01/2015 °Elsevier Interactive Patient Education © 2018 [employer] ° °  Safe Medications in Pregnancy   Acne:  Benzoyl Peroxide  Salicylic Acid   Backache/Headache:  Tylenol: 2 regular strength every 4 hours OR        2 Extra strength every 6 hours   Colds/Coughs/Allergies:  Benadryl (alcohol free) 25 mg every 6 hours as needed  Breath right strips  Claritin  Cepacol  throat lozenges  Chloraseptic throat spray  Cold-Eeze- up to three times per day  Cough drops, alcohol free  Flonase (by prescription only)  Guaifenesin  Mucinex  Robitussin DM (plain only, alcohol free)  Saline nasal spray/drops  Sudafed (pseudoephedrine) & Actifed * use only after [redacted] weeks gestation and if you do not have high blood pressure  Tylenol  Vicks Vaporub  Zinc lozenges  Zyrtec   Constipation:  Colace  Ducolax suppositories  Fleet enema  Glycerin suppositories  Metamucil  Milk of magnesia  Miralax  Senokot  Smooth move tea   Diarrhea:  Kaopectate  Imodium A-D   *NO pepto Bismol   Hemorrhoids:  Anusol  Anusol HC  Preparation H  Tucks   Indigestion:  Tums  Maalox  Mylanta  Zantac  Pepcid   Insomnia:  Benadryl (alcohol free) 25mg  every 6 hours as needed  Tylenol PM  Unisom, no Gelcaps   Leg Cramps:  Tums  MagGel   Nausea/Vomiting:  Bonine  Dramamine  Emetrol  Ginger extract  Sea bands  Meclizine  Nausea medication to take during pregnancy:  Unisom (doxylamine succinate 25 mg tablets) Take one tablet daily at bedtime. If symptoms are not adequately controlled, the dose can be increased to a maximum recommended dose of two tablets daily (1/2 tablet in the morning, 1/2 tablet mid-afternoon and one at bedtime).  Vitamin B6 100mg  tablets. Take one tablet twice a day (up to 200 mg per day).   Skin Rashes:  Aveeno products  Benadryl cream or 25mg  every 6 hours as needed  Calamine Lotion  1% cortisone cream   Yeast infection:  Gyne-lotrimin 7  Monistat 7    **If taking multiple medications, please check labels to avoid duplicating the same active ingredients  **take medication as directed on the label  ** Do not exceed 4000 mg of tylenol in 24 hours  **Do not take medications that contain aspirin or ibuprofen

## 2017-11-10 NOTE — MAU Note (Signed)
Back pain for the past couple hour, middle of the back and radiates down legs, has not tried any medicine at home  Fall down the stairs a few days ago  Nausea has been ongoing  Some spotting when wiping

## 2017-11-10 NOTE — MAU Provider Note (Signed)
History     CSN: [id number]  Arrival date and time: 11/10/17 1714   First Provider Initiated Contact with Patient 11/10/17 1746      Chief Complaint  Patient presents with  . Back Pain   HPI Brandy Sanders is a 22 y.o. [phone] at 2930w5d who presents to MAU today with complaint of mid to low back pain that radiates down her legs. She has not tried anything for pain. She rates pain as moderate. She also noted spotting today. Last intercourse was last night. She has had consistent N/V in the morning and occasional loose stools. She denies fever and has not tried anything for the nausea.   OB History    Gravida  4   Para  2   Term  1   Preterm  1   AB  1   Living  2     SAB  0   TAB  1   Ectopic  0   Multiple  0   Live Births  2           Past Medical History:  Diagnosis Date  . Anemia    Takes Iron supplement  . Cholestasis of pregnancy in third trimester   . Headache    relief with Tylenol  . Seizures (HCC) 2008   Had seizure in 3rd month of first pregnancy in 2013    Past Surgical History:  Procedure Laterality Date  . WISDOM TOOTH EXTRACTION  2014    Family History  Problem Relation Age of Onset  . Hypertension Mother   . Diabetes Maternal Grandmother     Social History   Tobacco Use  . Smoking status: Never Smoker  . Smokeless tobacco: Never Used  Substance Use Topics  . Alcohol use: Yes    Comment: occ  . Drug use: No    Allergies: No Known Allergies  Medications Prior to Admission  Medication Sig Dispense Refill Last Dose  . cephALEXin (KEFLEX) 500 MG capsule Take 1 capsule (500 mg total) by mouth 2 (two) times daily. 20 capsule 0   . Prenatal Vit-Fe Fumarate-FA (PRENATAL COMPLETE) 14-0.4 MG TABS Take 1 tablet by mouth daily. 60 each 1     Review of Systems  Constitutional: Negative for fever.  Gastrointestinal: Positive for abdominal pain, nausea and vomiting. Negative for constipation and diarrhea.  Genitourinary:  Positive for vaginal bleeding. Negative for dysuria, frequency, urgency and vaginal discharge.  Musculoskeletal: Positive for back pain.   Physical Exam   Blood pressure 112/62, pulse 93, temperature 98.6 F (37 C), temperature source Oral, resp. rate 18, weight 132 lb (59.9 kg), last menstrual period 09/10/2017, unknown if currently breastfeeding.  Physical Exam  Nursing note and vitals reviewed. Constitutional: She is oriented to person, place, and time. She appears well-developed and well-nourished. No distress.  HENT:  Head: Normocephalic and atraumatic.  Cardiovascular: Normal rate.  Respiratory: Effort normal.  GI: Soft. She exhibits no distension and no mass. There is no tenderness. There is no rebound and no guarding.  Genitourinary: Uterus is enlarged (slight). Uterus is not tender. Cervix exhibits no motion tenderness, no discharge and no friability. Right adnexum displays no mass and no tenderness. Left adnexum displays no mass and no tenderness. No bleeding in the vagina. Vaginal discharge (small, white) found.  Neurological: She is alert and oriented to person, place, and time.  Skin: Skin is warm and dry. No erythema.  Psychiatric: She has a normal mood and affect.  Results for orders placed or performed during the hospital encounter of 11/10/17 (from the past 24 hour(s))  Urinalysis, Routine w reflex microscopic     Status: Abnormal   Collection Time: 11/10/17  5:33 PM  Result Value Ref Range   Color, Urine YELLOW YELLOW   APPearance HAZY (A) CLEAR   Specific Gravity, Urine 1.031 (H) 1.005 - 1.030   pH 6.0 5.0 - 8.0   Glucose, UA NEGATIVE NEGATIVE mg/dL   Hgb urine dipstick NEGATIVE NEGATIVE   Bilirubin Urine NEGATIVE NEGATIVE   Ketones, ur NEGATIVE NEGATIVE mg/dL   Protein, ur NEGATIVE NEGATIVE mg/dL   Nitrite NEGATIVE NEGATIVE   Leukocytes, UA TRACE (A) NEGATIVE   RBC / HPF 0-5 0 - 5 RBC/hpf   WBC, UA 0-5 0 - 5 WBC/hpf   Bacteria, UA NONE SEEN NONE SEEN    Squamous Epithelial / LPF 11-20 0 - 5   Mucus PRESENT   Pregnancy, urine POC     Status: Abnormal   Collection Time: 11/10/17  5:36 PM  Result Value Ref Range   Preg Test, Ur POSITIVE (A) NEGATIVE  CBC with Differential/Platelet     Status: Abnormal   Collection Time: 11/10/17  5:52 PM  Result Value Ref Range   WBC 11.6 (H) 4.0 - 10.5 K/uL   RBC 4.90 3.87 - 5.11 MIL/uL   Hemoglobin 11.3 (L) 12.0 - 15.0 g/dL   HCT 40.9 81.1 - 91.4 %   MCV 73.5 (L) 78.0 - 100.0 fL   MCH 23.1 (L) 26.0 - 34.0 pg   MCHC 31.4 30.0 - 36.0 g/dL   RDW 78.2 (H) 95.6 - 21.3 %   Platelets 284 150 - 400 K/uL   Neutrophils Relative % 70 %   Neutro Abs 8.1 (H) 1.7 - 7.7 K/uL   Lymphocytes Relative 24 %   Lymphs Abs 2.8 0.7 - 4.0 K/uL   Monocytes Relative 3 %   Monocytes Absolute 0.4 0.1 - 1.0 K/uL   Eosinophils Relative 3 %   Eosinophils Absolute 0.3 0.0 - 0.7 K/uL   Basophils Relative 0 %   Basophils Absolute 0.0 0.0 - 0.1 K/uL  hCG, quantitative, pregnancy     Status: Abnormal   Collection Time: 11/10/17  5:52 PM  Result Value Ref Range   hCG, Beta Chain, Quant, S 178,103 (H) <5 mIU/mL  Wet prep, genital     Status: Abnormal   Collection Time: 11/10/17  6:07 PM  Result Value Ref Range   Yeast Wet Prep HPF POC NONE SEEN NONE SEEN   Trich, Wet Prep NONE SEEN NONE SEEN   Clue Cells Wet Prep HPF POC NONE SEEN NONE SEEN   WBC, Wet Prep HPF POC FEW (A) NONE SEEN   Sperm NONE SEEN    US Ob Comp Less 14 Wks  Result Date: 11/10/2017 CLINICAL DATA:  Spotting after intercourse EXAM: OBSTETRIC <14 WK [place] AND TRANSVAGINAL OB US TECHNIQUE: Both transabdominal and transvaginal ultrasound examinations were performed for complete evaluation of the gestation as well as the maternal uterus, adnexal regions, and pelvic cul-de-sac. Transvaginal technique was performed to assess early pregnancy. COMPARISON:  None. FINDINGS: Intrauterine gestational sac: Single Yolk sac:  Visualized Embryo:  Visualized Cardiac Activity:  Visualized Heart Rate: 161 bpm MSD:   mm    w     d CRL:  15.1 mm   7 w   6 d  [place] EDC: 06/23/2018 Subchorionic hemorrhage:  Small subchorionic hemorrhage Maternal uterus/adnexae: No adnexal mass or free fluid. IMPRESSION: Seven week 6 day intrauterine pregnancy. Fetal heart rate 161 beats per minute. Small subchorionic hemorrhage. Electronically Signed   By: Charlett Nose M.D.   On: 11/10/2017 19:14   US Ob Transvaginal  Result Date: 11/10/2017 CLINICAL DATA:  Spotting after intercourse EXAM: OBSTETRIC <14 WK [place] AND TRANSVAGINAL OB US TECHNIQUE: Both transabdominal and transvaginal ultrasound examinations were performed for complete evaluation of the gestation as well as the maternal uterus, adnexal regions, and pelvic cul-de-sac. Transvaginal technique was performed to assess early pregnancy. COMPARISON:  None. FINDINGS: Intrauterine gestational sac: Single Yolk sac:  Visualized Embryo:  Visualized Cardiac Activity: Visualized Heart Rate: 161 bpm MSD:   mm    w     d CRL:  15.1 mm   7 w   6 d                  [place] EDC: 06/23/2018 Subchorionic hemorrhage:  Small subchorionic hemorrhage Maternal uterus/adnexae: No adnexal mass or free fluid. IMPRESSION: Seven week 6 day intrauterine pregnancy. Fetal heart rate 161 beats per minute. Small subchorionic hemorrhage. Electronically Signed   By: Charlett Nose M.D.   On: 11/10/2017 19:14     MAU Course  Procedures None  MDM +UPT UA, wet prep, GC/chlamydia, CBC, quant hCG, HIV, RPR and [place] today to rule out ectopic pregnancy  Assessment and Plan  A: SIUP at 103w6d Spotting in pregnancy, first trimester Nausea and vomiting in pregnancy, first trimester  P: Discharge home Rx for Phenergan sent to patient's pharmacy List of OTC medications safe in pregnancy given First trimester precautions and diet for N/V in pregnancy discussed Patient advised to follow-up with [facility] as planned to start prenatal care  Patient may return to MAU  as needed or if her condition were to change or worsen  Vonzella Nipple , PA-C 11/10/2017, 7:48 PM

## 2017-11-10 NOTE — Progress Notes (Addendum)
G3P2 at 8.[redacted] wksga according to LMP. UPT + at the health [facility].   Presents to triage for back pain that radiates down the legs that started 2 hrs ago. Pt fell 2 days ago. No pain medication taken. Also c/o nausea and spot bleeding that started hr ago.   UPT at health dept 3 wks ago. [facility]   LMP: Sep 10, 2017  Last intercourse last night   VSS. BP 112/62 (BP Location: Right Arm)   Pulse 93   Temp 98.6 F (37 C) (Oral)   Resp 18   Wt 132 lb (59.9 kg)   LMP 09/10/2017   BMI 24.14 kg/m   Hx:  Anemia - takes iron C.Ha - tylenol helps SVD x2 both at [redacted]wksga dt cholestasis.  Seizure with 1st pregn in 2013   1752: lab at [facility]   Provider at [facility] assessing and to collect cultures; GC and wetprep   1837: U/S paged.   1841: U/S returned page. Ready for pt. Pt to U/S taken by nurse tech  1948: medicated per order  2007: D/c instructions given with pt understanding. Pt left unit via ambulatory with SO

## 2017-11-11 LAB — GC/CHLAMYDIA PROBE AMP (~~LOC~~) NOT AT ARMC
Chlamydia: NEGATIVE
Neisseria Gonorrhea: NEGATIVE

## 2017-11-11 LAB — HIV ANTIBODY (ROUTINE TESTING W REFLEX): HIV Screen 4th Generation wRfx: NONREACTIVE

## 2017-11-11 LAB — RPR: RPR Ser Ql: NONREACTIVE

## 2017-12-02 ENCOUNTER — Inpatient Hospital Stay (HOSPITAL_COMMUNITY)
Admission: AD | Admit: 2017-12-02 | Discharge: 2017-12-02 | Disposition: A | Discharge status: Home / Self Care | Payer: Medicaid Other | Source: Ambulatory Visit | Attending: Obstetrics and Gynecology | Admitting: Obstetrics and Gynecology

## 2017-12-02 ENCOUNTER — Other Ambulatory Visit: Payer: Self-pay

## 2017-12-02 ENCOUNTER — Encounter (HOSPITAL_COMMUNITY): Payer: Self-pay

## 2017-12-02 DIAGNOSIS — O210 Mild hyperemesis gravidarum: Secondary | ICD-10-CM | POA: Insufficient documentation

## 2017-12-02 DIAGNOSIS — O219 Vomiting of pregnancy, unspecified: Secondary | ICD-10-CM

## 2017-12-02 DIAGNOSIS — O99611 Diseases of the digestive system complicating pregnancy, first trimester: Secondary | ICD-10-CM | POA: Insufficient documentation

## 2017-12-02 DIAGNOSIS — Z3A11 11 weeks gestation of pregnancy: Secondary | ICD-10-CM | POA: Insufficient documentation

## 2017-12-02 DIAGNOSIS — K219 Gastro-esophageal reflux disease without esophagitis: Secondary | ICD-10-CM | POA: Diagnosis not present

## 2017-12-02 LAB — URINALYSIS, ROUTINE W REFLEX MICROSCOPIC
BACTERIA UA: NONE SEEN
Bilirubin Urine: NEGATIVE
GLUCOSE, UA: NEGATIVE mg/dL
Hgb urine dipstick: NEGATIVE
KETONES UR: 5 mg/dL — AB
Nitrite: NEGATIVE
PROTEIN: 30 mg/dL — AB
Specific Gravity, Urine: 1.026 (ref 1.005–1.030)
pH: 6 (ref 5.0–8.0)

## 2017-12-02 MED ORDER — LACTATED RINGERS IV BOLUS
1000.0000 mL | Freq: Once | INTRAVENOUS | Status: AC
  Administered 2017-12-02: 1000 mL via INTRAVENOUS

## 2017-12-02 MED ORDER — SODIUM CHLORIDE 0.9 % IV SOLN
8.0000 mg | Freq: Once | INTRAVENOUS | Status: AC
  Administered 2017-12-02: 8 mg via INTRAVENOUS
  Filled 2017-12-02: qty 4

## 2017-12-02 MED ORDER — RANITIDINE HCL 150 MG PO TABS: 150.0000 mg | ORAL_TABLET | Freq: Two times a day (BID) | ORAL | 0 refills | Status: DC

## 2017-12-02 MED ORDER — FAMOTIDINE IN NACL 20-0.9 MG/50ML-% IV SOLN
20.0000 mg | Freq: Once | INTRAVENOUS | Status: AC
  Administered 2017-12-02: 20 mg via INTRAVENOUS
  Filled 2017-12-02: qty 50

## 2017-12-02 MED ORDER — ONDANSETRON 8 MG PO TBDP: 8.0000 mg | ORAL_TABLET | Freq: Three times a day (TID) | ORAL | 0 refills | Status: DC | PRN

## 2017-12-02 MED ORDER — DEXTROSE 5 % IN LACTATED RINGERS IV BOLUS
1000.0000 mL | Freq: Once | INTRAVENOUS | Status: AC
  Administered 2017-12-02: 1000 mL via INTRAVENOUS

## 2017-12-02 NOTE — MAU Note (Addendum)
hasnt been able to eat since Sat.  Kept down some soup and fluids yesterday.  This morning when she was throwing up, she noted blood in it .  Feels so weak and dizzy. Has had loose stools, 2x/day

## 2017-12-02 NOTE — Discharge Instructions (Signed)
Nausea and Vomiting, Adult Feeling sick to your stomach (nausea) means that your stomach is upset or you feel like you have to throw up (vomit). Feeling more and more sick to your stomach can lead to throwing up. Throwing up happens when food and liquid from your stomach are thrown up and out the mouth. Throwing up can make you feel weak and cause you to get dehydrated. Dehydration can make you tired and thirsty, make you have a dry mouth, and make it so you pee (urinate) less often. Older adults and people with other diseases or a weak defense system (immune system) are at higher risk for dehydration. If you feel sick to your stomach or if you throw up, it is important to follow instructions from your doctor about how to take care of yourself. Follow these instructions at home: Eating and drinking Follow these instructions as told by your doctor:  Take an oral rehydration solution (ORS). This is a drink that is sold at pharmacies and stores.  Drink clear fluids in small amounts as you are able, such as: ? Water. ? Ice chips. ? Diluted fruit juice. ? Low-calorie sports drinks.  Eat bland, easy-to-digest foods in small amounts as you are able, such as: ? Bananas. ? Applesauce. ? Rice. ? Low-fat (lean) meats. ? Toast. ? Crackers.  Avoid fluids that have a lot of sugar or caffeine in them.  Avoid alcohol.  Avoid spicy or fatty foods.  General instructions  Drink enough fluid to keep your pee (urine) clear or pale yellow.  Wash your hands often. If you cannot use soap and water, use hand sanitizer.  Make sure that all people in your home wash their hands well and often.  Take over-the-counter and prescription medicines only as told by your doctor.  Rest at home while you get better.  Watch your condition for any changes.  Breathe slowly and deeply when you feel sick to your stomach.  Keep all follow-up visits as told by your doctor. This is important. Contact a doctor  if:  You have a fever.  You cannot keep fluids down.  Your symptoms get worse.  You have new symptoms.  You feel sick to your stomach for more than two days.  You feel light-headed or dizzy.  You have a headache.  You have muscle cramps. Get help right away if:  You have pain in your chest, neck, arm, or jaw.  You feel very weak or you pass out (faint).  You throw up again and again.  You see blood in your throw-up.  Your throw-up looks like black coffee grounds.  You have bloody or black poop (stools) or poop that look like tar.  You have a very bad headache, a stiff neck, or both.  You have a rash.  You have very bad pain, cramping, or bloating in your belly (abdomen).  You have trouble breathing.  You are breathing very quickly.  Your heart is beating very quickly.  Your skin feels cold and clammy.  You feel confused.  You have pain when you pee.  You have signs of dehydration, such as: ? Dark pee, hardly any pee, or no pee. ? Cracked lips. ? Dry mouth. ? Sunken eyes. ? Sleepiness. ? Weakness. These symptoms may be an emergency. Do not wait to see if the symptoms will go away. Get medical help right away. Call your local emergency services (911 in the [place] Do not drive yourself to the hospital. This information is   not intended to replace advice given to you by your health care provider. Make sure you discuss any questions you have with your health care provider. Document Released: 09/19/2007 Document Revised: 10/21/2015 Document Reviewed: 12/07/2014 Elsevier Interactive Patient Education  2018 Elsevier Inc.  

## 2017-12-02 NOTE — MAU Note (Signed)
Urine in lab 

## 2017-12-02 NOTE — MAU Provider Note (Signed)
History     CSN: [id number]  Arrival date and time: 12/02/17 1224   First Provider Initiated Contact with Patient 12/02/17 1348      Chief Complaint  Patient presents with  . Hemoptysis  . Emesis  . Dizziness   HPI   Brandy Sanders is a 22 y.o. female [id number] @ 965w6d here in MAU with complaints of N/V. The symptoms started at the beginning of her pregnancy. The symptoms have gotten worse over the weekend. She was prescribed medication that she was taking 1x per day which is not working (Phenergan). The last dose was Friday. Says she is vomiting more than 4x per day. She took a sip of Gatorade this morning and puked it right back up. No pain at this time. Some specks of blood noted in her vomit.   OB History    Gravida  4   Para  2   Term  1   Preterm  1   AB  1   Living  2     SAB  0   TAB  1   Ectopic  0   Multiple  0   Live Births  2           Past Medical History:  Diagnosis Date  . Anemia    Takes Iron supplement  . Cholestasis of pregnancy in third trimester   . Headache    relief with Tylenol  . Seizures (HCC) 2008   Had seizure in 3rd month of first pregnancy in 2013    Past Surgical History:  Procedure Laterality Date  . WISDOM TOOTH EXTRACTION  2014    Family History  Problem Relation Age of Onset  . Hypertension Mother   . Diabetes Maternal Grandmother     Social History   Tobacco Use  . Smoking status: Never Smoker  . Smokeless tobacco: Never Used  Substance Use Topics  . Alcohol use: Yes    Comment: occ  . Drug use: No    Allergies: No Known Allergies  Medications Prior to Admission  Medication Sig Dispense Refill Last Dose  . Prenatal Vit-Fe Fumarate-FA (PRENATAL COMPLETE) 14-0.4 MG TABS Take 1 tablet by mouth daily. 60 each 1   . promethazine (PHENERGAN) 12.5 MG tablet Take 1 tablet (12.5 mg total) by mouth every 6 (six) hours as needed for nausea or vomiting. 30 tablet 0    Results for orders placed or  performed during the hospital encounter of 12/02/17 (from the past 48 hour(s))  Urinalysis, Routine w reflex microscopic     Status: Abnormal   Collection Time: 12/02/17  1:16 PM  Result Value Ref Range   Color, Urine YELLOW YELLOW   APPearance HAZY (A) CLEAR   Specific Gravity, Urine 1.026 1.005 - 1.030   pH 6.0 5.0 - 8.0   Glucose, UA NEGATIVE NEGATIVE mg/dL   Hgb urine dipstick NEGATIVE NEGATIVE   Bilirubin Urine NEGATIVE NEGATIVE   Ketones, ur 5 (A) NEGATIVE mg/dL   Protein, ur 30 (A) NEGATIVE mg/dL   Nitrite NEGATIVE NEGATIVE   Leukocytes, UA MODERATE (A) NEGATIVE   RBC / HPF 0-5 0 - 5 RBC/hpf   WBC, UA >50 (H) 0 - 5 WBC/hpf   Bacteria, UA NONE SEEN NONE SEEN   Squamous Epithelial / LPF 11-20 0 - 5   Mucus PRESENT     Comment: Performed at [facility], [address] [place], [place] [address]    Review of Systems  Gastrointestinal: Positive for nausea  and vomiting. Negative for abdominal pain.  Neurological: Positive for dizziness, weakness and light-headedness.   Physical Exam   Blood pressure 105/69, pulse 98, temperature 98.2 F (36.8 C), temperature source Oral, resp. rate 16, weight 58.1 kg, last menstrual period 09/10/2017, SpO2 100 %, unknown if currently breastfeeding.  Physical Exam  Constitutional: She is oriented to person, place, and time. She appears well-developed and well-nourished. No distress.  HENT:  Head: Normocephalic.  Eyes: Pupils are equal, round, and reactive to light.  Respiratory: Effort normal.  GI: Soft. She exhibits no distension. There is no tenderness. There is no rebound.  Musculoskeletal: Normal range of motion.  Neurological: She is alert and oriented to person, place, and time.  Skin: Skin is warm. She is not diaphoretic.  Psychiatric: Her behavior is normal.   MAU Course  Procedures  None  MDM  + fetal heart tones via doppler  Urine shows ketones in urine D5LR bolus X 1 Pepcid & Zofran IV Patient had a vasovagal  episode following IV insertion. BP 70/40's patient awake and speaking without syncope. Bolus infusing. Stable at this time Patient was up and ambulating with out dizziness following 2 Liters Tolerating PO fluids at this time.   Assessment and Plan   A:  1. Nausea and vomiting during pregnancy   2. Gastroesophageal reflux disease, esophagitis presence not specified     P:  Discharge home in stable condition Rx: Pepcid, Zofran Increase oral fluids Bland diet at home Return to MAU if symptoms worsen  Markez Dowland, Harolyn RutherfordJennifer I, NP 12/02/2017 5:35 PM

## 2017-12-04 LAB — CULTURE, OB URINE
Culture: >=100000 — AB
SPECIAL REQUESTS: NORMAL

## 2017-12-05 ENCOUNTER — Telehealth: Payer: Self-pay | Admitting: Student

## 2017-12-05 ENCOUNTER — Encounter: Payer: Self-pay | Admitting: Student

## 2017-12-05 DIAGNOSIS — R8271 Bacteriuria: Secondary | ICD-10-CM

## 2017-12-05 MED ORDER — AMOXICILLIN 500 MG PO CAPS: 500.0000 mg | ORAL_CAPSULE | Freq: Three times a day (TID) | ORAL | 0 refills | Status: AC

## 2017-12-05 NOTE — Telephone Encounter (Signed)
Verified by name & DOB. Notified of positive urine culture. NKDA. Verified pharmacy. Discussed reasons to present to MAU.   Judeth HornLawrence, Brinda Focht, NP

## 2017-12-24 ENCOUNTER — Encounter (HOSPITAL_COMMUNITY): Payer: Self-pay

## 2017-12-24 ENCOUNTER — Inpatient Hospital Stay (HOSPITAL_COMMUNITY)
Admission: AD | Admit: 2017-12-24 | Discharge: 2017-12-25 | Disposition: A | Discharge status: Home / Self Care | Payer: Medicaid Other | Source: Ambulatory Visit | Attending: Obstetrics and Gynecology | Admitting: Obstetrics and Gynecology

## 2017-12-24 DIAGNOSIS — R112 Nausea with vomiting, unspecified: Secondary | ICD-10-CM

## 2017-12-24 DIAGNOSIS — R1110 Vomiting, unspecified: Secondary | ICD-10-CM | POA: Diagnosis present

## 2017-12-24 DIAGNOSIS — O219 Vomiting of pregnancy, unspecified: Secondary | ICD-10-CM | POA: Insufficient documentation

## 2017-12-24 DIAGNOSIS — Z8249 Family history of ischemic heart disease and other diseases of the circulatory system: Secondary | ICD-10-CM | POA: Insufficient documentation

## 2017-12-24 DIAGNOSIS — Z3A15 15 weeks gestation of pregnancy: Secondary | ICD-10-CM | POA: Insufficient documentation

## 2017-12-24 DIAGNOSIS — R8271 Bacteriuria: Secondary | ICD-10-CM

## 2017-12-24 LAB — URINALYSIS, ROUTINE W REFLEX MICROSCOPIC
Glucose, UA: NEGATIVE mg/dL
HGB URINE DIPSTICK: NEGATIVE
Ketones, ur: >80 mg/dL — AB
Leukocytes, UA: NEGATIVE
Nitrite: NEGATIVE
PH: 6.5 (ref 5.0–8.0)
Protein, ur: NEGATIVE mg/dL
Specific Gravity, Urine: 1.015 (ref 1.005–1.030)

## 2017-12-24 MED ORDER — LACTATED RINGERS IV BOLUS
1000.0000 mL | Freq: Once | INTRAVENOUS | Status: AC
  Administered 2017-12-25: 1000 mL via INTRAVENOUS

## 2017-12-24 MED ORDER — ONDANSETRON HCL 4 MG/2ML IJ SOLN
4.0000 mg | Freq: Once | INTRAMUSCULAR | Status: AC
  Administered 2017-12-25: 4 mg via INTRAVENOUS
  Filled 2017-12-24: qty 2

## 2017-12-24 NOTE — MAU Note (Signed)
Pt reports she has been having n/v all day. C/o headache and back pain also.

## 2017-12-25 DIAGNOSIS — O219 Vomiting of pregnancy, unspecified: Secondary | ICD-10-CM

## 2017-12-25 MED ORDER — TERCONAZOLE 0.4 % VA CREA: 1.0000 | TOPICAL_CREAM | Freq: Every day | VAGINAL | 0 refills | Status: DC

## 2017-12-25 MED ORDER — ONDANSETRON 4 MG PO TBDP: 4.0000 mg | ORAL_TABLET | Freq: Four times a day (QID) | ORAL | 2 refills | Status: DC | PRN

## 2017-12-25 MED ORDER — ONDANSETRON 4 MG PO TBDP: 4.0000 mg | ORAL_TABLET | Freq: Four times a day (QID) | ORAL | Status: DC | PRN

## 2017-12-25 MED ORDER — ACETAMINOPHEN 325 MG PO TABS: 650.0000 mg | ORAL_TABLET | Freq: Once | ORAL | Status: DC
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
## 2017-12-25 NOTE — Discharge Instructions (Signed)

## 2018-01-13 LAB — OB RESULTS CONSOLE HIV ANTIBODY (ROUTINE TESTING): HIV: NONREACTIVE

## 2018-01-13 LAB — OB RESULTS CONSOLE RUBELLA ANTIBODY, IGM: RUBELLA: NON-IMMUNE/NOT IMMUNE

## 2018-01-13 LAB — OB RESULTS CONSOLE GC/CHLAMYDIA
Chlamydia: NEGATIVE
Gonorrhea: NEGATIVE

## 2018-01-13 LAB — OB RESULTS CONSOLE ABO/RH: RH TYPE: POSITIVE

## 2018-01-13 LAB — OB RESULTS CONSOLE RPR: RPR: NONREACTIVE

## 2018-01-13 LAB — OB RESULTS CONSOLE HEPATITIS B SURFACE ANTIGEN: HEP B S AG: NEGATIVE

## 2018-01-13 LAB — OB RESULTS CONSOLE ANTIBODY SCREEN: ANTIBODY SCREEN: NEGATIVE

## 2018-01-14 ENCOUNTER — Encounter: Payer: Self-pay | Admitting: Neurology

## 2018-01-15 ENCOUNTER — Ambulatory Visit: Payer: Medicaid Other | Admitting: Neurology

## 2018-02-03 ENCOUNTER — Ambulatory Visit (INDEPENDENT_AMBULATORY_CARE_PROVIDER_SITE_OTHER): Payer: Medicaid Other | Admitting: Neurology

## 2018-02-03 ENCOUNTER — Other Ambulatory Visit: Payer: Self-pay

## 2018-02-03 ENCOUNTER — Encounter: Payer: Self-pay | Admitting: Neurology

## 2018-02-03 VITALS — BP 100/56 | HR 103 | Ht 62.5 in | Wt 128.0 lb

## 2018-02-03 DIAGNOSIS — R55 Syncope and collapse: Secondary | ICD-10-CM

## 2018-02-03 NOTE — Progress Notes (Signed)
NEUROLOGY CONSULTATION NOTE  Brandy Sanders MRN: [id number] DOB: 05-26-1995  Referring provider: Dr. Pryor Ochoa Primary care provider: none listed  Reason for consult:  Seizure disorder/syncope  Dear Dr Mindi Slicker:  Thank you for your kind referral of Brandy Sanders for consultation of the above symptoms. Although her history is well known to you, please allow me to reiterate it for the purpose of our medical record. She is alone in the office today. Records and images were personally reviewed where available.  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 She reports having a seizure at home in the 7th grade, she was brushing her teeth, felt lightheaded and dizzy, then fell and was witnessed to have convulsive activity. The next day, she had another convulsive episode. Family did not seek medical attention and attributed symptoms to dehydration. She reports recurrent syncopal episodes with all 3 of her pregnancies. When she was 3 months pregnant with her first child, she had a syncopal episode with report of convulsive activity. She had a head CT at that time which did not show any intracranial abnormalities. She has recurrent episodes where she starts having ringing in her ears, feels lightheaded and sees black dots in her vision, lasting 10-15 seconds. She would usually sit and symptoms pass, but they can occur while sitting or standing. She reports having similar symptoms that would proceed with loss of consciousness occurring 1-2 times a week. Witnesses would tell her she is out for 1-2 minutes then comes to without confusion or focal weakness, no tongue bite or incontinence. These have been happening since she was 2 months pregnant. She was having severe vomiting and has only started keeping food down 3 weeks  ago. The last time she completely lost consciousness was 3-4 weeks ago. They have all occurred while standing, except for one time while sitting during a blood draw. The last episode of ringing in ears/lightheadedness without loss of consciousness was last week. She denies any episodes of loss of consciousness when she is not pregnant. Her children are now 66 and 71 years of age. She had been on iron supplements in the past, and noticed that when she does not take it, she starts feeling worse. She occasionally becomes more aware of her heart beat. She occasionally tastes blood in her mouth, separate from the passing out episodes. She denies any deja vu, rising epigastric sensation, focal numbness/tingling/weakness, myoclonic jerks. With current pregnancy, she has had migraines over the frontal region with photosensitivity and nausea, lasting 2-3 hours. She feels this is getting better, she has milder ones around once a week that resolves after a nap. She has dizziness when standing or bending down. She has some neck and back pain. She denies any diplopia, dysarthria/dysphagia, focal numbness/tingling/weakness, bowel/bladder dysfunction. She had a normal birth and early development.  There is no history of febrile convulsions, CNS infections such as meningitis/encephalitis, significant traumatic brain injury, neurosurgical procedures, or family history of seizures.  PAST MEDICAL HISTORY: Past Medical History:  Diagnosis Date  . Anemia    Takes Iron supplement  . Cholestasis of pregnancy in third trimester   . Headache    relief with Tylenol  . Seizures (HCC) 2008   Had seizure in 3rd month of first pregnancy in 2013    PAST SURGICAL HISTORY: Past Surgical History:  Procedure Laterality Date  .  WISDOM TOOTH EXTRACTION  2014    MEDICATIONS: Current Outpatient Medications on File Prior to Visit  Medication Sig Dispense Refill  . ondansetron (ZOFRAN ODT) 4 MG disintegrating tablet Take 1 tablet (4 mg  total) by mouth every 6 (six) hours as needed for nausea. (Patient not taking: Reported on 02/03/2018) 30 tablet 2  . ondansetron (ZOFRAN ODT) 8 MG disintegrating tablet Take 1 tablet (8 mg total) by mouth every 8 (eight) hours as needed for nausea or vomiting. (Patient not taking: Reported on 02/03/2018) 20 tablet 0  . Prenatal Vit-Fe Fumarate-FA (PRENATAL COMPLETE) 14-0.4 MG TABS Take 1 tablet by mouth daily. (Patient not taking: Reported on 02/03/2018) 60 each 1  . promethazine (PHENERGAN) 12.5 MG tablet Take 1 tablet (12.5 mg total) by mouth every 6 (six) hours as needed for nausea or vomiting. (Patient not taking: Reported on 02/03/2018) 30 tablet 0  . ranitidine (ZANTAC) 150 MG tablet Take 1 tablet (150 mg total) by mouth 2 (two) times daily. (Patient not taking: Reported on 02/03/2018) 60 tablet 0  . terconazole (TERAZOL 7) 0.4 % vaginal cream Place 1 applicator vaginally at bedtime. (Patient not taking: Reported on 02/03/2018) 45 g 0   No current facility-administered medications on file prior to visit.     ALLERGIES: No Known Allergies  FAMILY HISTORY: Family History  Problem Relation Age of Onset  . Hypertension Mother   . Diabetes Maternal Grandmother     SOCIAL HISTORY: Social History   Socioeconomic History  . Marital status: Single    Spouse name: Not on file  . Number of children: Not on file  . Years of education: Not on file  . Highest education level: Not on file  Occupational History  . Not on file  Social Needs  . Financial resource strain: Not on file  . Food insecurity:    Worry: Not on file    Inability: Not on file  . Transportation needs:    Medical: Not on file    Non-medical: Not on file  Tobacco Use  . Smoking status: Never Smoker  . Smokeless tobacco: Never Used  Substance and Sexual Activity  . Alcohol use: Yes    Comment: occ  . Drug use: No  . Sexual activity: Yes    Partners: Male    Birth control/protection: None  Lifestyle  .  Physical activity:    Days per week: Not on file    Minutes per session: Not on file  . Stress: Not on file  Relationships  . Social connections:    Talks on phone: Not on file    Gets together: Not on file    Attends religious service: Not on file    Active member of club or organization: Not on file    Attends meetings of clubs or organizations: Not on file    Relationship status: Not on file  . Intimate partner violence:    Fear of current or ex partner: Not on file    Emotionally abused: Not on file    Physically abused: Not on file    Forced sexual activity: Not on file  Other Topics Concern  . 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, [employer]    REVIEW OF SYSTEMS: Constitutional: No fevers, chills, or sweats, no  generalized fatigue, change in appetite Eyes: No visual changes, double vision, eye pain Ear, nose and throat: No hearing loss, ear pain, nasal congestion, sore throat Cardiovascular: No chest pain, palpitations Respiratory:  No shortness of breath at rest or with exertion, wheezes GastrointestinaI: No nausea, vomiting, diarrhea, abdominal pain, fecal incontinence Genitourinary:  No dysuria, urinary retention or frequency Musculoskeletal:  + neck pain, back pain Integumentary: No rash, pruritus, skin lesions Neurological: as above Psychiatric: No depression, insomnia, anxiety Endocrine: No palpitations, fatigue, diaphoresis, mood swings, change in appetite, change in weight, increased thirst Hematologic/Lymphatic:  No anemia, purpura, petechiae. Allergic/Immunologic: no itchy/runny eyes, nasal congestion, recent allergic reactions, rashes  PHYSICAL EXAM: Vitals:   02/03/18 1321  BP: (!) 100/56  Pulse: (!) 103  SpO2: 99%   Orthostatic VS for the past 24 hrs (Last 3 readings):   BP- Lying Pulse- Lying BP- Sitting Pulse- Sitting BP- Standing at 0 minutes Pulse- Standing at 0 minutes  02/03/18 1538 106/78 87 104/60 100 112/54 110   General: No acute distress Head:  Normocephalic/atraumatic Eyes: Fundoscopic exam shows bilateral sharp discs, no vessel changes, exudates, or hemorrhages Neck: supple, no paraspinal tenderness, full range of motion Back: No paraspinal tenderness Heart: regular rate and rhythm Lungs: Clear to auscultation bilaterally. Vascular: No carotid bruits. Skin/Extremities: No rash, no edema Neurological Exam: Mental status: alert and oriented to person, place, and time, no dysarthria or aphasia, Fund of knowledge is appropriate.  Recent and remote memory are intact.  Attention and concentration are normal.    Able to name objects and repeat phrases. Cranial nerves: CN I: not tested CN II: pupils equal, round and reactive to light, visual fields intact, fundi unremarkable. CN III, IV, VI:  full range of motion, no nystagmus, no ptosis CN V: facial sensation intact CN VII: upper and lower face symmetric CN VIII: hearing intact to finger rub CN IX, X: gag intact, uvula midline CN XI: sternocleidomastoid and trapezius muscles intact CN XII: tongue midline Bulk & Tone: normal, no fasciculations. Motor: 5/5 throughout with no pronator drift. Sensation: intact to light touch, cold, pin, vibration and joint position sense.  No extinction to double simultaneous stimulation.  Romberg test negative Deep Tendon Reflexes: +1 throughout, no ankle clonus Plantar responses: downgoing bilaterally Cerebellar: no incoordination on finger to nose, heel to shin. No dysdiadochokinesia Gait: narrow-based and steady, able to tandem walk adequately. 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 She reports presyncopal and syncopal episodes only occur while pregnant. She  reports 2 convulsions in childhood but did not seek medical care at that time. Her neurological exam is normal, no clear epilepsy risk factors. We discussed diagnosis of presyncope/syncope/convulsive syncope. Head CT in 2013 was normal. Echocardiogram and EEG will be ordered to rule out other causes of similar symptoms, although seizures are less likely. She is noted to have a 20-pt drop in diastolic BP from supine to standing, although systolic did not drop, HR also increased. Instructed to increase fluid intake, liberalize salt intake. Would hold off on driving during this time period. If tests are normal, follow-up on prn basis.   Thank you for allowing me to participate in the care of this patient. Please do not hesitate to call for any questions or concerns.   Brandy Sanders, M.D.  CC: Dr. Mindi Slicker

## 2018-02-03 NOTE — Patient Instructions (Addendum)
1. Schedule routine EEG 2. Schedule echocardiogram 3. As per [place] driving laws, no driving after an episode of loss of consciousness until 6 months event-free 4. If tests are normal, follow-up on as needed basis.

## 2018-02-04 ENCOUNTER — Ambulatory Visit (HOSPITAL_COMMUNITY): Payer: Medicaid Other | Attending: Cardiovascular Disease

## 2018-02-04 ENCOUNTER — Other Ambulatory Visit: Payer: Self-pay

## 2018-02-04 DIAGNOSIS — R55 Syncope and collapse: Secondary | ICD-10-CM

## 2018-02-05 ENCOUNTER — Ambulatory Visit (INDEPENDENT_AMBULATORY_CARE_PROVIDER_SITE_OTHER): Payer: Medicaid Other | Admitting: Neurology

## 2018-02-05 DIAGNOSIS — R55 Syncope and collapse: Secondary | ICD-10-CM | POA: Diagnosis not present

## 2018-02-10 NOTE — Procedures (Signed)
ELECTROENCEPHALOGRAM REPORT  Date of Study: 02/05/2018  Patient's Name: Brandy Sanders MRN: [id number] Date of Birth: 17-Jan-1996  Referring Provider: Dr. Patrcia Dolly  Clinical History: This is a 22 year old pregnant woman with recurrent syncope/presyncope during pregnancy. Remote history of childhood seizures  Medications: No AEDs  Technical Summary: A multichannel digital EEG recording measured by the international 10-20 system with electrodes applied with paste and impedances below 5000 ohms performed in our laboratory with EKG monitoring in an awake and asleep patient.  Hyperventilation was not performed. Photic stimulation was performed.  The digital EEG was referentially recorded, reformatted, and digitally filtered in a variety of bipolar and referential montages for optimal display.    Description: The patient is awake and asleep during the recording.  During maximal wakefulness, there is a symmetric, medium voltage 10 Hz posterior dominant rhythm that attenuates with eye opening.  The record is symmetric.  During drowsiness and sleep, there is an increase in theta slowing of the background.  Vertex waves and symmetric sleep spindles were seen.  Photic stimulation did not elicit any abnormalities.  There were no epileptiform discharges or electrographic seizures seen.    EKG lead was unremarkable.  Impression: This awake and asleep EEG is normal.    Clinical Correlation: A normal EEG does not exclude a clinical diagnosis of epilepsy.  If further clinical questions remain, prolonged EEG may be helpful.  Clinical correlation is advised.   Patrcia Dolly, M.D.

## 2018-03-20 ENCOUNTER — Other Ambulatory Visit (HOSPITAL_COMMUNITY): Payer: Self-pay | Admitting: Obstetrics and Gynecology

## 2018-03-20 DIAGNOSIS — R1011 Right upper quadrant pain: Secondary | ICD-10-CM

## 2018-03-24 ENCOUNTER — Ambulatory Visit (HOSPITAL_COMMUNITY)
Admission: RE | Admit: 2018-03-24 | Discharge: 2018-03-24 | Disposition: A | Discharge status: Home / Self Care | Payer: Medicaid Other | Source: Ambulatory Visit | Attending: Obstetrics and Gynecology | Admitting: Obstetrics and Gynecology

## 2018-03-24 DIAGNOSIS — R1011 Right upper quadrant pain: Secondary | ICD-10-CM | POA: Diagnosis present

## 2018-03-31 ENCOUNTER — Ambulatory Visit: Payer: Medicaid Other | Admitting: Neurology

## 2018-04-11 ENCOUNTER — Telehealth: Payer: Self-pay | Admitting: Neurology

## 2018-04-11 NOTE — Telephone Encounter (Signed)
Report printed and faxed to number provided below

## 2018-04-11 NOTE — Telephone Encounter (Signed)
Shanda BumpsJessica is calling from [facility] Please fax this over to [phone]; ATTN: Dr.Banga. Thanks!

## 2018-04-16 NOTE — L&D Delivery Note (Signed)
Delivery Note Pt complete with uncontrollable urge to push. She pushed twice and at 8:05 PM a viable female was delivered via Vaginal, Spontaneous (Presentation: ROT;  ).  APGAR: 8,9, ; weight pending .   Placenta status:delivered intact, duncan , .  Cord: 3vc with the following complications: .  Cord pH: n/a  Anesthesia: none  Episiotomy: None Lacerations: None Suture Repair: n/a Est. Blood Loss (mL): 258  Mom to postpartum.  Baby to Couplet care / Skin to Skin.  Cathrine Muster 06/02/2018, 8:23 PM

## 2018-05-23 LAB — OB RESULTS CONSOLE GBS: GBS: POSITIVE

## 2018-05-25 LAB — OB RESULTS CONSOLE GBS: GBS: POSITIVE

## 2018-05-26 ENCOUNTER — Telehealth (HOSPITAL_COMMUNITY): Payer: Self-pay | Admitting: *Deleted

## 2018-05-26 NOTE — Telephone Encounter (Signed)
Preadmission screen  

## 2018-05-27 ENCOUNTER — Encounter (HOSPITAL_COMMUNITY): Payer: Self-pay | Admitting: *Deleted

## 2018-06-02 ENCOUNTER — Inpatient Hospital Stay (HOSPITAL_COMMUNITY)
Admission: RE | Admit: 2018-06-02 | Discharge: 2018-06-04 | DRG: 805 | Disposition: A | Discharge status: Home / Self Care | Payer: Medicaid Other | Attending: Obstetrics and Gynecology | Admitting: Obstetrics and Gynecology

## 2018-06-02 ENCOUNTER — Encounter (HOSPITAL_COMMUNITY): Payer: Self-pay

## 2018-06-02 VITALS — BP 106/56 | HR 61 | Temp 98.1°F | Resp 18 | Ht 61.0 in | Wt 156.4 lb

## 2018-06-02 DIAGNOSIS — K831 Obstruction of bile duct: Secondary | ICD-10-CM | POA: Diagnosis present

## 2018-06-02 DIAGNOSIS — O99824 Streptococcus B carrier state complicating childbirth: Secondary | ICD-10-CM | POA: Diagnosis present

## 2018-06-02 DIAGNOSIS — D649 Anemia, unspecified: Secondary | ICD-10-CM | POA: Diagnosis present

## 2018-06-02 DIAGNOSIS — O9902 Anemia complicating childbirth: Secondary | ICD-10-CM | POA: Diagnosis present

## 2018-06-02 DIAGNOSIS — Z3A37 37 weeks gestation of pregnancy: Secondary | ICD-10-CM

## 2018-06-02 DIAGNOSIS — O26613 Liver and biliary tract disorders in pregnancy, third trimester: Secondary | ICD-10-CM

## 2018-06-02 DIAGNOSIS — O26643 Intrahepatic cholestasis of pregnancy, third trimester: Secondary | ICD-10-CM | POA: Diagnosis present

## 2018-06-02 DIAGNOSIS — R8271 Bacteriuria: Secondary | ICD-10-CM

## 2018-06-02 DIAGNOSIS — O2662 Liver and biliary tract disorders in childbirth: Secondary | ICD-10-CM | POA: Diagnosis present

## 2018-06-02 DIAGNOSIS — O99354 Diseases of the nervous system complicating childbirth: Secondary | ICD-10-CM | POA: Diagnosis present

## 2018-06-02 DIAGNOSIS — G40909 Epilepsy, unspecified, not intractable, without status epilepticus: Secondary | ICD-10-CM | POA: Diagnosis present

## 2018-06-02 LAB — CBC
HCT: 33.4 % — ABNORMAL LOW (ref 36.0–46.0)
Hemoglobin: 10.3 g/dL — ABNORMAL LOW (ref 12.0–15.0)
MCH: 22.5 pg — ABNORMAL LOW (ref 26.0–34.0)
MCHC: 30.8 g/dL (ref 30.0–36.0)
MCV: 73.1 fL — AB (ref 80.0–100.0)
Platelets: 207 10*3/uL (ref 150–400)
RBC: 4.57 MIL/uL (ref 3.87–5.11)
RDW: 20.2 % — ABNORMAL HIGH (ref 11.5–15.5)
WBC: 8.9 10*3/uL (ref 4.0–10.5)

## 2018-06-02 LAB — TYPE AND SCREEN
ABO/RH(D): O POS
Antibody Screen: NEGATIVE

## 2018-06-02 LAB — RPR: RPR Ser Ql: NONREACTIVE

## 2018-06-02 MED ORDER — OXYCODONE-ACETAMINOPHEN 5-325 MG PO TABS: 1.0000 | ORAL_TABLET | ORAL | Status: DC | PRN

## 2018-06-02 MED ORDER — MISOPROSTOL 25 MCG QUARTER TABLET
25.0000 ug | ORAL_TABLET | ORAL | Status: DC | PRN
  Administered 2018-06-02 (×2): 25 ug via VAGINAL
  Filled 2018-06-02 (×3): qty 1

## 2018-06-02 MED ORDER — SOD CITRATE-CITRIC ACID 500-334 MG/5ML PO SOLN: 30.0000 mL | ORAL | Status: DC | PRN

## 2018-06-02 MED ORDER — ACETAMINOPHEN 325 MG PO TABS
650.0000 mg | ORAL_TABLET | ORAL | Status: DC | PRN
  Administered 2018-06-02 – 2018-06-03 (×5): 650 mg via ORAL
  Filled 2018-06-02 (×5): qty 2

## 2018-06-02 MED ORDER — ACETAMINOPHEN 325 MG PO TABS: 650.0000 mg | ORAL_TABLET | ORAL | Status: DC | PRN

## 2018-06-02 MED ORDER — SIMETHICONE 80 MG PO CHEW: 80.0000 mg | CHEWABLE_TABLET | ORAL | Status: DC | PRN

## 2018-06-02 MED ORDER — TETANUS-DIPHTH-ACELL PERTUSSIS 5-2.5-18.5 LF-MCG/0.5 IM SUSP: 0.5000 mL | Freq: Once | INTRAMUSCULAR | Status: DC

## 2018-06-02 MED ORDER — LACTATED RINGERS IV SOLN
INTRAVENOUS | Status: DC
  Administered 2018-06-02 (×2): via INTRAVENOUS

## 2018-06-02 MED ORDER — PENICILLIN G 3 MILLION UNITS IVPB - SIMPLE MED
3.0000 10*6.[IU] | INTRAVENOUS | Status: DC
  Administered 2018-06-02 (×4): 3 10*6.[IU] via INTRAVENOUS
  Filled 2018-06-02 (×5): qty 100

## 2018-06-02 MED ORDER — COCONUT OIL OIL: 1.0000 "application " | TOPICAL_OIL | Status: DC | PRN

## 2018-06-02 MED ORDER — LIDOCAINE HCL (PF) 1 % IJ SOLN
30.0000 mL | INTRAMUSCULAR | Status: DC | PRN
  Filled 2018-06-02: qty 30

## 2018-06-02 MED ORDER — ONDANSETRON HCL 4 MG/2ML IJ SOLN: 4.0000 mg | Freq: Four times a day (QID) | INTRAMUSCULAR | Status: DC | PRN

## 2018-06-02 MED ORDER — OXYCODONE HCL 5 MG PO TABS
5.0000 mg | ORAL_TABLET | ORAL | Status: DC | PRN
  Administered 2018-06-03 (×3): 5 mg via ORAL
  Filled 2018-06-02 (×3): qty 1

## 2018-06-02 MED ORDER — OXYTOCIN 40 UNITS IN NORMAL SALINE INFUSION - SIMPLE MED
2.5000 [IU]/h | INTRAVENOUS | Status: DC
  Administered 2018-06-02: 2.5 [IU]/h via INTRAVENOUS

## 2018-06-02 MED ORDER — SODIUM CHLORIDE 0.9 % IV SOLN
5.0000 10*6.[IU] | Freq: Once | INTRAVENOUS | Status: AC
  Administered 2018-06-02: 5 10*6.[IU] via INTRAVENOUS
  Filled 2018-06-02: qty 5

## 2018-06-02 MED ORDER — ZOLPIDEM TARTRATE 5 MG PO TABS: 5.0000 mg | ORAL_TABLET | Freq: Every evening | ORAL | Status: DC | PRN

## 2018-06-02 MED ORDER — OXYTOCIN 10 UNIT/ML IJ SOLN
INTRAMUSCULAR | Status: AC
  Filled 2018-06-02: qty 2

## 2018-06-02 MED ORDER — VITAMIN K1 1 MG/0.5ML IJ SOLN
INTRAMUSCULAR | Status: AC
  Filled 2018-06-02: qty 0.5

## 2018-06-02 MED ORDER — OXYCODONE HCL 5 MG PO TABS: 10.0000 mg | ORAL_TABLET | ORAL | Status: DC | PRN

## 2018-06-02 MED ORDER — TERBUTALINE SULFATE 1 MG/ML IJ SOLN
0.2500 mg | Freq: Once | INTRAMUSCULAR | Status: DC | PRN
  Filled 2018-06-02: qty 1

## 2018-06-02 MED ORDER — ZOLPIDEM TARTRATE 5 MG PO TABS
5.0000 mg | ORAL_TABLET | Freq: Every evening | ORAL | Status: DC | PRN
  Administered 2018-06-02: 5 mg via ORAL
  Filled 2018-06-02: qty 1

## 2018-06-02 MED ORDER — BENZOCAINE-MENTHOL 20-0.5 % EX AERO: 1.0000 "application " | INHALATION_SPRAY | CUTANEOUS | Status: DC | PRN

## 2018-06-02 MED ORDER — LACTATED RINGERS IV SOLN: 500.0000 mL | INTRAVENOUS | Status: DC | PRN

## 2018-06-02 MED ORDER — ONDANSETRON HCL 4 MG PO TABS: 4.0000 mg | ORAL_TABLET | ORAL | Status: DC | PRN

## 2018-06-02 MED ORDER — IBUPROFEN 600 MG PO TABS
600.0000 mg | ORAL_TABLET | Freq: Four times a day (QID) | ORAL | Status: DC
  Administered 2018-06-02 – 2018-06-04 (×6): 600 mg via ORAL
  Filled 2018-06-02 (×6): qty 1

## 2018-06-02 MED ORDER — DIBUCAINE 1 % RE OINT: 1.0000 "application " | TOPICAL_OINTMENT | RECTAL | Status: DC | PRN

## 2018-06-02 MED ORDER — OXYCODONE-ACETAMINOPHEN 5-325 MG PO TABS: 2.0000 | ORAL_TABLET | ORAL | Status: DC | PRN

## 2018-06-02 MED ORDER — OXYTOCIN 40 UNITS IN NORMAL SALINE INFUSION - SIMPLE MED
1.0000 m[IU]/min | INTRAVENOUS | Status: DC
  Administered 2018-06-02: 2 m[IU]/min via INTRAVENOUS
  Filled 2018-06-02: qty 1000

## 2018-06-02 MED ORDER — PRENATAL MULTIVITAMIN CH
1.0000 | ORAL_TABLET | Freq: Every day | ORAL | Status: DC
  Administered 2018-06-03: 1 via ORAL
  Filled 2018-06-02: qty 1

## 2018-06-02 MED ORDER — BUTORPHANOL TARTRATE 1 MG/ML IJ SOLN
1.0000 mg | INTRAMUSCULAR | Status: DC | PRN
  Administered 2018-06-02 (×2): 1 mg via INTRAVENOUS
  Filled 2018-06-02 (×2): qty 1

## 2018-06-02 MED ORDER — OXYTOCIN BOLUS FROM INFUSION
500.0000 mL | Freq: Once | INTRAVENOUS | Status: AC
  Administered 2018-06-02: 500 mL via INTRAVENOUS

## 2018-06-02 MED ORDER — ONDANSETRON HCL 4 MG/2ML IJ SOLN: 4.0000 mg | INTRAMUSCULAR | Status: DC | PRN

## 2018-06-02 MED ORDER — DIPHENHYDRAMINE HCL 25 MG PO CAPS: 25.0000 mg | ORAL_CAPSULE | Freq: Four times a day (QID) | ORAL | Status: DC | PRN

## 2018-06-02 MED ORDER — SENNOSIDES-DOCUSATE SODIUM 8.6-50 MG PO TABS
2.0000 | ORAL_TABLET | ORAL | Status: DC
  Administered 2018-06-02 – 2018-06-03 (×2): 2 via ORAL
  Filled 2018-06-02 (×2): qty 2

## 2018-06-02 MED ORDER — WITCH HAZEL-GLYCERIN EX PADS: 1.0000 "application " | MEDICATED_PAD | CUTANEOUS | Status: DC | PRN

## 2018-06-02 NOTE — Progress Notes (Signed)
Patient ID: Brandy Sanders, female   DOB: September 26, 1995, 23 y.o.   MRN: [id number] Pt reports decreasing pain with contractions. +Fms VSS EFM - cat 1 TOCO - irreg contractions q 1-39mins SVE - 4/80/-2  A/P: [id number] at 37 wks s/p cytotec and AROM and pitocin now at - iol due to chol pf preganncy          IUPC placed- continue pitocin per protocol          Anticipate svd

## 2018-06-02 NOTE — Progress Notes (Signed)
Patient ID: Brandy Sanders, female   DOB: March 28, 1996, 23 y.o.   MRN: [id number] Pt reports painful contractions.  VSS EFM - cat 1, no decels, moderate variability, 150 TOCO - ctxs q ; pit at ; mvus>220 SVE - 6/90/-1  A/P: Progressing well in labor on pitocin; s/p stadol x 1         Continue with expectant mgmt

## 2018-06-02 NOTE — Anesthesia Pain Management Evaluation Note (Signed)
  CRNA Pain Management Visit Note  Patient: Brandy Sanders, 23 y.o., female  "Hello I am a member of the anesthesia team at [facility]. We have an anesthesia team available at all times to provide care throughout the hospital, including epidural management and anesthesia for C-section. I don't know your plan for the delivery whether it a natural birth, water birth, IV sedation, nitrous supplementation, doula or epidural, but we want to meet your pain goals."   1.Was your pain managed to your expectations on prior hospitalizations?   Yes   2.What is your expectation for pain management during this hospitalization?     Labor support without medications  3.How can we help you reach that goal? natural  Record the patient's initial score and the patient's pain goal.   Pain: 5  Pain Goal: 10 [facility] wants you to be able to say your pain was always managed very well.  Loida Calamia 06/02/2018

## 2018-06-02 NOTE — H&P (Signed)
Brandy Sanders is a 23 [id number]  female presenting at 24 0/7wks for  iol for cholestasis of pregnancy. Pt is dated per 7 week [place]. Pregnancy was complicated as well by anemia, late onset of care. Pt has a history of a seizure disorder - stable through pregnancy. Hx of fast labor. GBS treated with PCN OB History    Gravida  4   Para  2   Term  1   Preterm  1   AB  1   Living  2     SAB  0   TAB  1   Ectopic  0   Multiple  0   Live Births  2          Past Medical History:  Diagnosis Date  . Anemia    Takes Iron supplement  . Cholestasis of pregnancy in third trimester   . Headache    relief with Tylenol  . Seizures (HCC) 2008   Had seizure in 3rd month of first pregnancy in 2013   Past Surgical History:  Procedure Laterality Date  . WISDOM TOOTH EXTRACTION  2014   Family History: family history includes Diabetes in her maternal grandmother; Hypertension in her mother. Social History:  reports that she has never smoked. She has never used smokeless tobacco. She reports current alcohol use. She reports that she does not use drugs.     Maternal Diabetes: No Genetic Screening: Declined Maternal Ultrasounds/Referrals: Normal Fetal Ultrasounds or other Referrals:  None Maternal Substance Abuse:  No Significant Maternal Medications:  Meds include: Other: urosodiol tid Significant Maternal Lab Results:  Lab values include: Group B Strep positive Other Comments:  None  Review of Systems  Constitutional: Negative for chills, fever, malaise/fatigue and weight loss.  Eyes: Negative for blurred vision.  Respiratory: Negative for shortness of breath.   Cardiovascular: Negative for chest pain.  Gastrointestinal: Negative for abdominal pain, heartburn, nausea and vomiting.  Genitourinary: Negative for dysuria.  Musculoskeletal: Negative for myalgias.  Skin: Positive for itching.  Neurological: Negative for dizziness and headaches.  Endo/Heme/Allergies:  Negative for environmental allergies. Does not bruise/bleed easily.  Psychiatric/Behavioral: Negative for depression, hallucinations, substance abuse and suicidal ideas. The patient is not nervous/anxious.    Maternal Medical History:  Reason for admission: Nausea. Scheduled iol for cholestasis of pregnancy  Contractions: Onset was 1-2 hours ago.   Frequency: regular.   Perceived severity is mild.    Fetal activity: Perceived fetal activity is normal.   Last perceived fetal movement was within the past hour.    Prenatal complications: Cholestasis of pregnancy, anemia, history of seizure disorder    Dilation: 4 Effacement (%): 70 Station: -2 Exam by:: Dr. Mindi Slicker Blood pressure 119/78, pulse 82, temperature 98.1 F (36.7 C), temperature source Oral, resp. rate 18, height 5\' 1"  (1.549 m), weight 70.9 kg, last menstrual period 09/10/2017, unknown if currently breastfeeding. Maternal Exam:  Uterine Assessment: Contraction strength is mild.  Contraction frequency is regular.   Abdomen: Patient reports generalized tenderness.  Estimated fetal weight is AGA.   Fetal presentation: vertex  Introitus: Normal vulva. Vulva is negative for condylomata and lesion.  Normal vagina.  Vagina is negative for condylomata.  Amniotic fluid character: clear.  Pelvis: adequate for delivery.   Cervix: Cervix evaluated by digital exam.     Fetal Exam Fetal Monitor Review: Baseline rate: 150.  Variability: moderate (6-25 bpm).   Pattern: accelerations present and no decelerations.    Fetal State Assessment: Category I - tracings are  normal.     Physical Exam  Constitutional: She is oriented to person, place, and time. She appears well-developed.  Neck: Normal range of motion.  Cardiovascular: Normal rate.  Respiratory: Effort normal.  GI: Soft. There is generalized abdominal tenderness.  Genitourinary:    Vulva, vagina and uterus normal.     No vulval condylomata or lesion noted.    Musculoskeletal: Normal range of motion.  Neurological: She is alert and oriented to person, place, and time.  Skin: Skin is warm.  Psychiatric: She has a normal mood and affect. Her behavior is normal. Judgment and thought content normal.    Prenatal labs: ABO, Rh: --/--/O POS (02/17 0101) Antibody: NEG (02/17 0101) Rubella: Nonimmune (09/30 0000) RPR: Nonreactive (09/30 0000)  HBsAg: Negative (09/30 0000)  HIV: Non-reactive (09/30 0000)  GBS: Positive (02/07 0000)   Assessment/Plan: 23yo [id number] at  37 0/7wks for  iol for cholestasis of pregnancy; s/p cytotec x 2 GBS pos - PCN Pain control prn AROM with clear fluid noted  Anticipate svd  Brandy Sanders 06/02/2018, 9:08 AM

## 2018-06-03 ENCOUNTER — Other Ambulatory Visit: Payer: Self-pay

## 2018-06-03 LAB — CBC
HCT: 29.6 % — ABNORMAL LOW (ref 36.0–46.0)
Hemoglobin: 8.9 g/dL — ABNORMAL LOW (ref 12.0–15.0)
MCH: 22.4 pg — ABNORMAL LOW (ref 26.0–34.0)
MCHC: 30.1 g/dL (ref 30.0–36.0)
MCV: 74.6 fL — AB (ref 80.0–100.0)
Platelets: 205 10*3/uL (ref 150–400)
RBC: 3.97 MIL/uL (ref 3.87–5.11)
RDW: 19.4 % — ABNORMAL HIGH (ref 11.5–15.5)
WBC: 14.1 10*3/uL — ABNORMAL HIGH (ref 4.0–10.5)
nRBC: 0 % (ref 0.0–0.2)

## 2018-06-03 MED ORDER — MEASLES, MUMPS & RUBELLA VAC IJ SOLR
0.5000 mL | Freq: Once | INTRAMUSCULAR | Status: DC
  Filled 2018-06-03: qty 0.5

## 2018-06-03 NOTE — Progress Notes (Signed)
Patient given MMR VIS 

## 2018-06-03 NOTE — Progress Notes (Addendum)
Post Partum Day 1 Subjective: no complaints, up ad lib, voiding, tolerating PO and nl lochia, pain controlled  Some increased cramping.    Objective: Blood pressure (!) 107/59, pulse 67, temperature 98.1 F (36.7 C), temperature source Oral, resp. rate 18, height 5\' 1"  (1.549 m), weight 70.9 kg, last menstrual period 09/10/2017, SpO2 99 %, unknown if currently breastfeeding.  Physical Exam:  General: alert and no distress Lochia: appropriate Uterine Fundus: firm   Recent Labs    06/02/18 0101 06/03/18 0534  HGB 10.3* 8.9*  HCT 33.4* 29.6*    Assessment/Plan: Plan for discharge tomorrow, Breastfeeding and Lactation consult.  Will get BTL PP.  Routine PP care   LOS: 1 day   Haliey Romberg Bovard-Stuckert 06/03/2018, 7:33 AM

## 2018-06-03 NOTE — Lactation Note (Signed)
This note was copied from a baby's chart. Lactation Consultation Note  Patient Name: Brandy Sanders [id number] Date: 06/03/2018 Reason for consult: Early term 72-38.6wks  P3 mother whose infant is now 7 hours old.  Mother breast fed her first child for 6 months and her second child for 4 months.  Mother was breast feeding when I arrived.  Baby was sucking rhythmically with wide gape and flanged lips .  Mother denied pain with latching. Baby had a LATCH score of 9.  She had no questions/concerns related to breast feeding at this time.  Encouraged to feed 8-12 times/24 hours or sooner if baby shows feeding cues.  Mother is familiar with feeding cues and hand expression.  Colostrum container provided for any EBM she obtains with hand expression.  Milk storage times reviewed.    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  She does not feel a need to obtain one at this time.  Informed her that we will provided a manual pump at discharge.  Mother will call for questions/concerns.  Father present.   Maternal Data Formula Feeding for Exclusion: No Has patient been taught Hand Expression?: Yes Does the patient have breastfeeding experience prior to this delivery?: Yes  Feeding Feeding Type: Breast Fed  LATCH Score Latch: Grasps breast easily, tongue down, lips flanged, rhythmical sucking.  Audible Swallowing: A few with stimulation  Type of Nipple: Everted at rest and after stimulation  Comfort (Breast/Nipple): Soft / non-tender  Hold (Positioning): No assistance needed to correctly position infant at breast.  LATCH Score: 9  Interventions Interventions: Breast feeding basics reviewed;Skin to skin  Lactation Tools Discussed/Used     Consult Status Consult Status: Follow-up Date: 06/04/18 Follow-up type: In-patient    Jeyli Zwicker R Bindi Klomp 06/03/2018, 2:51 PM

## 2018-06-04 MED ORDER — IBUPROFEN 600 MG PO TABS: 600.0000 mg | ORAL_TABLET | Freq: Four times a day (QID) | ORAL | 0 refills | Status: DC

## 2018-06-04 MED ORDER — ACETAMINOPHEN 325 MG PO TABS: 650.0000 mg | ORAL_TABLET | ORAL | 0 refills | Status: DC | PRN

## 2018-06-04 NOTE — Progress Notes (Signed)
Post Partum Day 2 Subjective: no complaints, up ad lib and tolerating PO  Cramping improved.  Itching much better but still has a bit  Objective: Blood pressure (!) 106/56, pulse 61, temperature 98.1 F (36.7 C), temperature source Oral, resp. rate 18, height 5\' 1"  (1.549 m), weight 70.9 kg, last menstrual period 09/10/2017, SpO2 99 %, unknown if currently breastfeeding.  Physical Exam:  General: alert and cooperative Lochia: appropriate Uterine Fundus: firm   Recent Labs    06/02/18 0101 06/03/18 0534  HGB 10.3* 8.9*  HCT 33.4* 29.6*    Assessment/Plan: Discharge home  Plans interval tubal   LOS: 2 days   Oliver Pila 06/04/2018, 9:33 AM

## 2018-06-04 NOTE — Discharge Summary (Signed)
OB Discharge Summary     Patient Name: Brandy Sanders DOB: [birth year] MRN: [id number]  Date of admission: 06/02/2018 Delivering MD: Pryor Ochoa [facility]   Date of discharge: 06/04/2018  Admitting diagnosis: INDUCTION Intrauterine pregnancy: [redacted]w[redacted]d     Secondary diagnosis:  Active Problems:   Cholestasis of pregnancy in third trimester   SVD (spontaneous vaginal delivery)   Postpartum care following vaginal delivery  Additional problems: none     Discharge diagnosis: Term Pregnancy Delivered                                                                                                Post partum procedures:none  Augmentation: AROM, Pitocin and Cytotec  Complications: None  Hospital course:  Induction of Labor With Vaginal Delivery   23 y.o. yo [phone] at [redacted]w[redacted]d was admitted to the hospital 06/02/2018 for induction of labor.  Indication for induction: Cholestasis of pregnancy.  Patient had an uncomplicated labor course as follows: Membrane Rupture Time/Date: 9:01 AM ,06/02/2018   Intrapartum Procedures: Episiotomy: None [1]                                         Lacerations:  None [1]  Patient had delivery of a Viable infant.  Information for the patient's newborn:  Renaldo Harrison Girl Keionna [[id number]]  Delivery Method: Vaginal, Spontaneous(Filed from Delivery Summary)   06/02/2018  Details of delivery can be found in separate delivery note.  Patient had a routine postpartum course. Patient is discharged home 06/04/18.  Physical exam  Vitals:   06/03/18 1100 06/03/18 1418 06/03/18 2153 06/04/18 0524  BP: 111/70 107/61 101/70 (!) 106/56  Pulse: 65 78 77 61  Resp: 18 17 18 18   Temp: 98 F (36.7 C) 97.6 F (36.4 C) 97.8 F (36.6 C) 98.1 F (36.7 C)  TempSrc: Oral Oral Oral Oral  SpO2:  99% 99%   Weight:      Height:       General: alert and cooperative Lochia: appropriate Uterine Fundus: firm  Labs: Lab Results  Component Value Date   WBC 14.1 (H)  06/03/2018   HGB 8.9 (L) 06/03/2018   HCT 29.6 (L) 06/03/2018   MCV 74.6 (L) 06/03/2018   PLT 205 06/03/2018   CMP Latest Ref Rng & Units 04/04/2017  Glucose 65 - 99 mg/dL 540(G)  BUN 6 - 20 mg/dL 9  Creatinine 8.67 - 6.19 mg/dL 5.09  Sodium 326 - 712 mmol/L 135  Potassium 3.5 - 5.1 mmol/L 3.2(L)  Chloride 101 - 111 mmol/L 105  CO2 22 - 32 mmol/L 23  Calcium 8.9 - 10.3 mg/dL 9.2  Total Protein 6.5 - 8.1 g/dL 4.5(Y)  Total Bilirubin 0.3 - 1.2 mg/dL 0.4  Alkaline Phos 38 - 126 U/L 76  AST 15 - 41 U/L 19  ALT 14 - 54 U/L 19    Discharge instruction: per After Visit Summary and "Baby and Me Booklet".  After visit meds:  Allergies as of 06/04/2018   No Known Allergies  Medication List    STOP taking these medications   ondansetron 4 MG disintegrating tablet Commonly known as:  ZOFRAN ODT   ondansetron 8 MG disintegrating tablet Commonly known as:  ZOFRAN ODT   promethazine 12.5 MG tablet Commonly known as:  PHENERGAN   ranitidine 150 MG tablet Commonly known as:  ZANTAC   terconazole 0.4 % vaginal cream Commonly known as:  TERAZOL 7   ursodiol 250 MG tablet Commonly known as:  ACTIGALL     TAKE these medications   acetaminophen 325 MG tablet Commonly known as:  TYLENOL Take 2 tablets (650 mg total) by mouth every 4 (four) hours as needed (for pain scale < 4).   hydrOXYzine 25 MG tablet Commonly known as:  ATARAX/VISTARIL Take 25 mg by mouth every 6 (six) hours as needed for itching.   ibuprofen 600 MG tablet Commonly known as:  ADVIL,MOTRIN Take 1 tablet (600 mg total) by mouth every 6 (six) hours.   PRENATAL COMPLETE 14-0.4 MG Tabs Take 1 tablet by mouth daily.       Diet: routine diet  Activity: Advance as tolerated. Pelvic rest for 6 weeks.   Outpatient follow up:4 weeks Follow up Appt:No future appointments. Follow up Visit:No follow-ups on file.  Postpartum contraception: planning interval tubal  Newborn Data: Live born female  Birth  Weight: 6 lb 7.9 oz (2945 g) APGAR: 8, 9  Newborn Delivery   Birth date/time:  06/02/2018 20:05:00 Delivery type:  Vaginal, Spontaneous     Baby Feeding: Bottle and Breast Disposition:home with mother   06/04/2018 Oliver Pila, MD

## 2018-06-04 NOTE — Lactation Note (Signed)
This note was copied from a baby's chart. Lactation Consultation Note  Patient Name: Brandy Sanders [id number]'[id number] Date: 06/04/2018 Reason for consult: Follow-up assessment;Early term 37-38.6wks   P3, Baby 37 hours old.  Mother wants to formula feed and breastfeed. Encouraged breastfeeding before offering formula.  Discussed supply and demand. Feed on demand approximately 8-12 times per day.   Reviewed engorgement care and monitoring voids/stools. Provided mother w/ manual pump.    Maternal Data    Feeding Feeding Type: Bottle Fed - Formula Nipple Type: Slow - flow  LATCH Score                   Interventions Interventions: Hand pump;Breast feeding basics reviewed  Lactation Tools Discussed/Used     Consult Status Consult Status: Complete Date: 06/04/18    Brandy Sanders [facility] 06/04/2018, 9:51 AM

## 2019-01-30 ENCOUNTER — Other Ambulatory Visit: Payer: Self-pay

## 2019-01-30 DIAGNOSIS — Z20822 Contact with and (suspected) exposure to covid-19: Secondary | ICD-10-CM

## 2019-02-01 LAB — NOVEL CORONAVIRUS, NAA: SARS-CoV-2, NAA: NOT DETECTED

## 2019-11-11 ENCOUNTER — Ambulatory Visit: Payer: Medicaid Other | Attending: Internal Medicine

## 2019-11-11 DIAGNOSIS — Z20822 Contact with and (suspected) exposure to covid-19: Secondary | ICD-10-CM

## 2019-11-12 LAB — NOVEL CORONAVIRUS, NAA: SARS-CoV-2, NAA: NOT DETECTED

## 2019-11-12 LAB — SARS-COV-2, NAA 2 DAY TAT

## 2020-12-21 ENCOUNTER — Emergency Department (HOSPITAL_COMMUNITY)
Admission: EM | Admit: 2020-12-21 | Discharge: 2020-12-21 | Disposition: A | Discharge status: Home / Self Care | Payer: Medicaid Other | Attending: Emergency Medicine | Admitting: Emergency Medicine

## 2020-12-21 ENCOUNTER — Emergency Department (HOSPITAL_COMMUNITY): Payer: Medicaid Other

## 2020-12-21 ENCOUNTER — Encounter (HOSPITAL_COMMUNITY): Payer: Self-pay | Admitting: Oncology

## 2020-12-21 ENCOUNTER — Other Ambulatory Visit: Payer: Self-pay

## 2020-12-21 DIAGNOSIS — M25552 Pain in left hip: Secondary | ICD-10-CM | POA: Diagnosis not present

## 2020-12-21 DIAGNOSIS — M5416 Radiculopathy, lumbar region: Secondary | ICD-10-CM | POA: Diagnosis not present

## 2020-12-21 DIAGNOSIS — N9489 Other specified conditions associated with female genital organs and menstrual cycle: Secondary | ICD-10-CM | POA: Diagnosis not present

## 2020-12-21 DIAGNOSIS — M5126 Other intervertebral disc displacement, lumbar region: Secondary | ICD-10-CM

## 2020-12-21 DIAGNOSIS — M5136 Other intervertebral disc degeneration, lumbar region: Secondary | ICD-10-CM

## 2020-12-21 LAB — BASIC METABOLIC PANEL
Anion gap: 6 (ref 5–15)
BUN: 11 mg/dL (ref 6–20)
CO2: 22 mmol/L (ref 22–32)
Calcium: 9 mg/dL (ref 8.9–10.3)
Chloride: 108 mmol/L (ref 98–111)
Creatinine, Ser: 0.46 mg/dL (ref 0.44–1.00)
GFR, Estimated: >60 mL/min (ref >60)
Glucose, Bld: 122 mg/dL — ABNORMAL HIGH (ref 70–99)
Potassium: 3.6 mmol/L (ref 3.5–5.1)
Sodium: 136 mmol/L (ref 135–145)

## 2020-12-21 LAB — CBC
HCT: 36.5 % (ref 36.0–46.0)
Hemoglobin: 11.7 g/dL — ABNORMAL LOW (ref 12.0–15.0)
MCH: 25.9 pg — ABNORMAL LOW (ref 26.0–34.0)
MCHC: 32.1 g/dL (ref 30.0–36.0)
MCV: 80.9 fL (ref 80.0–100.0)
Platelets: 247 10*3/uL (ref 150–400)
RBC: 4.51 MIL/uL (ref 3.87–5.11)
RDW: 15.3 % (ref 11.5–15.5)
WBC: 17.5 10*3/uL — ABNORMAL HIGH (ref 4.0–10.5)
nRBC: 0 % (ref 0.0–0.2)

## 2020-12-21 LAB — URINALYSIS, ROUTINE W REFLEX MICROSCOPIC
Bilirubin Urine: NEGATIVE
Glucose, UA: NEGATIVE mg/dL
Hgb urine dipstick: NEGATIVE
Ketones, ur: NEGATIVE mg/dL
Nitrite: NEGATIVE
Protein, ur: NEGATIVE mg/dL
Specific Gravity, Urine: 1.006 (ref 1.005–1.030)
pH: 7 (ref 5.0–8.0)

## 2020-12-21 LAB — I-STAT BETA HCG BLOOD, ED (MC, WL, AP ONLY): I-stat hCG, quantitative: <5 m[IU]/mL (ref <5)

## 2020-12-21 MED ORDER — CYCLOBENZAPRINE HCL 10 MG PO TABS
10.0000 mg | ORAL_TABLET | Freq: Once | ORAL | Status: AC
  Administered 2020-12-21: 10 mg via ORAL
  Filled 2020-12-21: qty 1

## 2020-12-21 MED ORDER — SODIUM CHLORIDE 0.9 % IV SOLN: Freq: Once | INTRAVENOUS | Status: AC

## 2020-12-21 MED ORDER — ONDANSETRON HCL 4 MG/2ML IJ SOLN
4.0000 mg | Freq: Once | INTRAMUSCULAR | Status: AC
  Administered 2020-12-21: 4 mg via INTRAVENOUS
  Filled 2020-12-21: qty 2

## 2020-12-21 MED ORDER — DEXAMETHASONE SODIUM PHOSPHATE 10 MG/ML IJ SOLN
10.0000 mg | Freq: Once | INTRAMUSCULAR | Status: AC
  Administered 2020-12-21: 10 mg via INTRAVENOUS
  Filled 2020-12-21: qty 1

## 2020-12-21 MED ORDER — HYDROMORPHONE HCL 1 MG/ML IJ SOLN
1.0000 mg | Freq: Once | INTRAMUSCULAR | Status: DC
  Filled 2020-12-21: qty 1

## 2020-12-21 MED ORDER — KETOROLAC TROMETHAMINE 30 MG/ML IJ SOLN
30.0000 mg | Freq: Once | INTRAMUSCULAR | Status: AC
  Administered 2020-12-21: 30 mg via INTRAVENOUS
  Filled 2020-12-21: qty 1

## 2020-12-21 MED ORDER — LORAZEPAM 2 MG/ML IJ SOLN
1.0000 mg | Freq: Once | INTRAMUSCULAR | Status: AC
  Administered 2020-12-21: 1 mg via INTRAVENOUS
  Filled 2020-12-21: qty 1

## 2020-12-21 MED ORDER — CYCLOBENZAPRINE HCL 10 MG PO TABS: 10.0000 mg | ORAL_TABLET | Freq: Two times a day (BID) | ORAL | 0 refills | Status: DC | PRN

## 2020-12-21 NOTE — ED Provider Notes (Signed)
Brandy Sanders   CSN: [id number] Arrival date & time: 12/21/20  1136     History Chief Complaint  Patient presents with   Hip Pain    Brandy Sanders is a 25 y.o. female.  HPI Patient reports she was reaching above her dryer and all of a sudden felt a pop in her left hip.  Reports pain was really severe.  She could not keep her weight on it.  She had a low herself before.  She was unable then to bear weight.  She had to call EMS.  Patient denies any other injury.  She is never had anything similar happen before.  Patient was given fentanyl for pain by EMS.  She reports this made her extremely nauseated.    Patient last ate at 8 AM.  She had a bagel.  Past Medical History:  Diagnosis Date   Anemia    Takes Iron supplement   Cholestasis of pregnancy in third trimester    Headache    relief with Tylenol   Seizures (HCC) 2008   Had seizure in 3rd month of first pregnancy in 2013   SVD (spontaneous vaginal delivery) 06/02/2018    Patient Active Problem List   Diagnosis Date Noted   Cholestasis of pregnancy in third trimester 06/02/2018   SVD (spontaneous vaginal delivery) 06/02/2018   Postpartum care following vaginal delivery 06/02/2018   GBS bacteriuria 12/05/2017   Seizure disorder (HCC) 10/02/2011    Past Surgical History:  Procedure Laterality Date   WISDOM TOOTH EXTRACTION  2014     OB History     Gravida  4   Para  3   Term  2   Preterm  1   AB  1   Living  3      SAB  0   IAB  1   Ectopic  0   Multiple  0   Live Births  3           Family History  Problem Relation Age of Onset   Hypertension Mother    Diabetes Maternal Grandmother     Social History   Tobacco Use   Smoking status: Never   Smokeless tobacco: Never  Vaping Use   Vaping Use: Never used  Substance Use Topics   Alcohol use: Yes    Comment: occ   Drug use: No    Home Medications Prior to Admission  medications   Medication Sig Start Date End Date Taking? Authorizing Provider  acetaminophen (TYLENOL) 325 MG tablet Take 2 tablets (650 mg total) by mouth every 4 (four) hours as needed (for pain scale < 4). 06/04/18   Huel Cote, MD  hydrOXYzine (ATARAX/VISTARIL) 25 MG tablet Take 25 mg by mouth every 6 (six) hours as needed for itching.     [provider]  ibuprofen (ADVIL,MOTRIN) 600 MG tablet Take 1 tablet (600 mg total) by mouth every 6 (six) hours. 06/04/18   Huel Cote, MD  Prenatal Vit-Fe Fumarate-FA (PRENATAL COMPLETE) 14-0.4 MG TABS Take 1 tablet by mouth daily. Patient not taking: Reported on 02/03/2018 04/04/17   Pisciotta, Joni Reining, PA-C    Allergies    Patient has no known allergies.  Review of Systems   Review of Systems 10 systems reviewed and negative except as per HPI Physical Exam Updated Vital Signs BP 90/66   Pulse (!) 117   Temp 98.3 F (36.8 C) (Oral)   Resp 16   Ht 5\' 2"  (  1.575 m)   Wt 67.1 kg   LMP  (LMP Unknown) Comment: Believes it was in July; preg test waiver signed 12/21/20  SpO2 100%   BMI 27.07 kg/m   Physical Exam Constitutional:      Appearance: Normal appearance.  Eyes:     Extraocular Movements: Extraocular movements intact.  Cardiovascular:     Rate and Rhythm: Normal rate and regular rhythm.  Pulmonary:     Effort: Pulmonary effort is normal.     Breath sounds: Normal breath sounds.  Musculoskeletal:     Comments: Patient is keeping left hip flexed.  Distal extremity warm and dry.  Skin:    General: Skin is warm and dry.  Neurological:     General: No focal deficit present.     Mental Status: She is alert and oriented to person, place, and time.     Coordination: Coordination normal.    ED Results / Procedures / Treatments   Labs (all labs ordered are listed, but only abnormal results are displayed) Labs Reviewed  BASIC METABOLIC PANEL - Abnormal; Notable for the following components:      Result Value    Glucose, Bld 122 (*)    All other components within normal limits  CBC - Abnormal; Notable for the following components:   WBC 17.5 (*)    Hemoglobin 11.7 (*)    MCH 25.9 (*)    All other components within normal limits  URINALYSIS, ROUTINE W REFLEX MICROSCOPIC  I-STAT BETA HCG BLOOD, ED ([facility], [facility], [facility] ONLY)    EKG None  Radiology DG Hip Unilat With Pelvis 2-3 Views Left  Result Date: 12/21/2020 CLINICAL DATA:  Left hip injury, reaching for something and felt a pop and pain EXAM: DG HIP (WITH OR WITHOUT PELVIS) 2-3V LEFT COMPARISON:  None. FINDINGS: There is no evidence of displaced hip fracture or dislocation. There is no evidence of arthropathy or other focal bone abnormality. IMPRESSION: No displaced fracture or dislocation of the left hip. The joint space is preserved. Electronically Signed   By: Lauralyn Primes M.D.   On: 12/21/2020 13:33    Procedures Procedures   Medications Ordered in ED Medications  HYDROmorphone (DILAUDID) injection 1 mg (1 mg Intravenous Patient Refused/Not Given 12/21/20 1535)  LORazepam (ATIVAN) injection 1 mg (0 mg Intravenous Hold 12/21/20 1535)  ketorolac (TORADOL) 30 MG/ML injection 30 mg (30 mg Intravenous Given 12/21/20 1455)  ondansetron (ZOFRAN) injection 4 mg (4 mg Intravenous Given 12/21/20 1455)  dexamethasone (DECADRON) injection 10 mg (10 mg Intravenous Given 12/21/20 1455)  0.9 %  sodium chloride infusion ( Intravenous New Bag/Given 12/21/20 1517)    ED Course  I have reviewed the triage vital signs and the nursing notes.  Pertinent labs & imaging results that were available during my care of the patient were reviewed by me and considered in my medical decision making (see chart for details).    MDM Rules/Calculators/A&P                           Patient presents with sudden and severe onset of left lower back pain and hip pain after a position change.  No history of prior back problems.  Hip x-ray within normal limits no dislocation or acute  findings.  After Toradol patient continues to have significant pain and difficulty extending her leg and lying on her back.  She reports feeling a paresthesia down the left leg to the foot.  We  will proceed with MRI.  Pain medication with Dilaudid and Decadron administered.  Pending MRI results.  Patient may be considered for discharge if MRI does not show any emergent neurologic compromise and patient is adequately pain controlled. Final Clinical Impression(s) / ED Diagnoses Final diagnoses:  Left hip pain  Lumbar radiculopathy    Rx / DC Orders ED Discharge Orders     None        Arby Barrette, MD 12/21/20 1704

## 2020-12-21 NOTE — ED Notes (Signed)
Pt ambulated to restroom without issues.

## 2020-12-21 NOTE — ED Provider Notes (Signed)
   MDM  25 year old female [place] the [place] [facility] with sudden onset back/hip pain after reaching above her dryer. Difficulty ambulating, no red flag symptoms.  MRI of the lumbar spine was pending at signout.  MRI revealed: L4-L5 mild disc bulge with central protrusion and annular fissure.  No spinal canal stenosis or neural foraminal narrowing.    On my assessment, the patient has no red flag symptoms.  She denies lower extremity weakness or numbness, perineal numbness, saddle anesthesia, urinary or fecal incontinence.  She states that her pain is improved somewhat.  She has a negative straight leg raise test bilaterally.  She has a normal neurologic exam.  She has no tenderness of her hips bilaterally and has intact range of motion of both hips.  No concern for occult fracture of either hip at this time.  Suspect symptoms are likely due to new onset bulging disc.  The patient does not have a PCP.  I recommended the patient establish care with a PCP so she can continue to manage her symptoms outpatient.  I recommended Tylenol and ibuprofen for pain control, Flexeril was prescribed for muscle spasm, intermittent heat and ice, no heavy lifting for 4 weeks, follow-up with PCP when able.       Ernie Avena, MD 12/21/20 2022

## 2020-12-21 NOTE — ED Triage Notes (Signed)
Pt bib from home by [facility] s/p left hip injury.  Pt reported to EMS reaching up above her dryer for something when she felt a pop from her left hip accompanied by severe pain.  Pt was able to lower herself onto the ground.  D/t position/pain EMS used scoop chair to get pt off of floor, were unable to determine if shortening or rotation of left leg were present.  Pt given 100 mcg total of fentanyl, 4 mg zofran, and 200 ml's NS given to BP of 86/50.

## 2020-12-21 NOTE — Discharge Instructions (Addendum)
Recommend Tylenol, ibuprofen for pain control.  Flexeril will be prescribed for muscle spasms.  Do not drive while taking Flexeril.  Recommend follow-up with a primary physician to reassess your pain.  No heavy lifting for 4 weeks.  Return for new onset lower extremity weakness, worsening/severe pain, numbness in your groin, loss of bowel or bladder function.

## 2021-02-06 ENCOUNTER — Ambulatory Visit (INDEPENDENT_AMBULATORY_CARE_PROVIDER_SITE_OTHER): Payer: Medicaid Other | Admitting: Family Medicine

## 2021-02-06 ENCOUNTER — Other Ambulatory Visit: Payer: Self-pay

## 2021-02-06 ENCOUNTER — Encounter: Payer: Self-pay | Admitting: Family Medicine

## 2021-02-06 VITALS — BP 103/70 | HR 92 | Temp 98.0°F | Resp 16 | Ht 61.0 in | Wt 137.0 lb

## 2021-02-06 DIAGNOSIS — M62830 Muscle spasm of back: Secondary | ICD-10-CM | POA: Diagnosis not present

## 2021-02-06 DIAGNOSIS — M5126 Other intervertebral disc displacement, lumbar region: Secondary | ICD-10-CM | POA: Diagnosis not present

## 2021-02-06 DIAGNOSIS — Z7689 Persons encountering health services in other specified circumstances: Secondary | ICD-10-CM

## 2021-02-06 MED ORDER — CYCLOBENZAPRINE HCL 10 MG PO TABS: 10.0000 mg | ORAL_TABLET | Freq: Two times a day (BID) | ORAL | 1 refills | Status: DC | PRN

## 2021-02-06 NOTE — Progress Notes (Signed)
New Patient Office Visit  Subjective:  Patient ID: Brandy Sanders, female    DOB: [birth year]  Age: 25 y.o. MRN: [id number]  CC:  Chief Complaint  Patient presents with   Follow-up    HPI Brandy Sanders presents for to establish care and for follow up of a recent herniated disc. She reports acute onset of lumbar disc herniation for which she was seen in ED. She has since been seeing a chiropractor and reports great improvement of symptoms.   Past Medical History:  Diagnosis Date   Anemia    Takes Iron supplement   Cholestasis of pregnancy in third trimester    Headache    relief with Tylenol   Seizures ([facility]) 2008   Had seizure in 3rd month of first pregnancy in 2013   SVD (spontaneous vaginal delivery) 06/02/2018    Past Surgical History:  Procedure Laterality Date   WISDOM TOOTH EXTRACTION  2014    Family History  Problem Relation Age of Onset   Hypertension Mother    Diabetes Maternal Grandmother     Social History   Socioeconomic History   Marital status: Single    Spouse name: Not on file   Number of children: Not on file   Years of education: Not on file   Highest education level: Not on file  Occupational History   Not on file  Tobacco Use   Smoking status: Never   Smokeless tobacco: Never  Vaping Use   Vaping Use: Never used  Substance and Sexual Activity   Alcohol use: Yes    Comment: occ   Drug use: No   Sexual activity: Yes    Partners: Male    Birth control/protection: None  Other Topics Concern   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  All other systems reviewed and are negative.  Objective:   Today's Vitals: BP 103/70   Pulse 92   Temp 98 F (36.7 C) (Oral)   Resp 16   Ht 5\' 1"  (1.549 m)   Wt 137 lb (62.1 kg)   SpO2 96%   BMI 25.89 kg/m   Physical Exam Vitals and nursing note reviewed.  Constitutional:      General: She is not in acute distress. Cardiovascular:     Rate and Rhythm: Normal rate and regular rhythm.  Pulmonary:     Effort: Pulmonary effort is normal.     Breath sounds: Normal breath sounds.  Musculoskeletal:     Lumbar back: Spasms and tenderness present. No swelling or deformity. Normal range of motion.  Neurological:     General: No focal deficit present.     Mental Status: She is alert and oriented to person, place, and time.    Assessment & Plan:   1. Lumbar herniated disc Improving. Management as per consultant.  2. Back spasm Improving. Flexeril refilled. Continue with management as per  Research scientist (medical).   3. Encounter to establish care     Outpatient Encounter Medications as of 02/06/2021  Medication Sig   acetaminophen (TYLENOL) 325 MG tablet Take 2 tablets (650 mg total) by mouth every 4 (four) hours as needed (for pain scale < 4).   cyclobenzaprine (FLEXERIL) 10 MG tablet Take 1 tablet (10 mg total) by mouth 2 (two) times daily as needed for muscle spasms.   hydrOXYzine (ATARAX/VISTARIL) 25 MG tablet Take 25 mg by mouth every 6 (six) hours as needed for itching.    ibuprofen (ADVIL,MOTRIN) 600 MG tablet Take 1 tablet (600 mg total) by mouth every 6 (six) hours.   Prenatal Vit-Fe Fumarate-FA (PRENATAL COMPLETE) 14-0.4 MG TABS Take 1 tablet by mouth daily.   No facility-administered encounter medications on file as of 02/06/2021.    Follow-up: Return in about 6 months (around 08/07/2021) for physical.   Tommie Raymond,  MD

## 2021-02-06 NOTE — Progress Notes (Signed)
Patient is here for f/up herniated disc . Patient is doing well with no other concerns.

## 2021-08-07 ENCOUNTER — Encounter: Payer: Medicaid Other | Admitting: Family Medicine

## 2021-09-12 DIAGNOSIS — F39 Unspecified mood [affective] disorder: Secondary | ICD-10-CM | POA: Insufficient documentation

## 2021-09-15 ENCOUNTER — Ambulatory Visit (INDEPENDENT_AMBULATORY_CARE_PROVIDER_SITE_OTHER): Payer: Medicaid Other | Admitting: Family Medicine

## 2021-09-15 ENCOUNTER — Encounter: Payer: Self-pay | Admitting: Family Medicine

## 2021-09-15 VITALS — BP 110/74 | HR 91 | Temp 98.1°F | Resp 16 | Ht 61.0 in | Wt 145.8 lb

## 2021-09-15 DIAGNOSIS — Z130 Encounter for screening for diseases of the blood and blood-forming organs and certain disorders involving the immune mechanism: Secondary | ICD-10-CM

## 2021-09-15 DIAGNOSIS — Z0000 Encounter for general adult medical examination without abnormal findings: Secondary | ICD-10-CM

## 2021-09-15 DIAGNOSIS — Z1159 Encounter for screening for other viral diseases: Secondary | ICD-10-CM

## 2021-09-15 DIAGNOSIS — Z13220 Encounter for screening for lipoid disorders: Secondary | ICD-10-CM

## 2021-09-15 NOTE — Progress Notes (Signed)
Patient is here for here complete physical examination. Patient said she has no new concerns today

## 2021-09-15 NOTE — Progress Notes (Signed)
Established Patient Office Visit  Subjective    Patient ID: Brandy Sanders, female    DOB: September 30, 1995  Age: 26 y.o. MRN: [id number]  CC:  Chief Complaint  Patient presents with   Annual Exam    HPI [name] presents for routine annual physical exam. She denies acute complaints or concerns.    Outpatient Encounter Medications as of 09/15/2021  Medication Sig   acetaminophen (TYLENOL) 325 MG tablet Take 2 tablets (650 mg total) by mouth every 4 (four) hours as needed (for pain scale < 4). (Patient not taking: Reported on 09/15/2021)   cetirizine (ZYRTEC) 10 MG chewable tablet Zyrtec   cyclobenzaprine (FLEXERIL) 10 MG tablet Take 1 tablet (10 mg total) by mouth 2 (two) times daily as needed for muscle spasms. (Patient not taking: Reported on 09/15/2021)   docusate sodium (STOOL SOFTENER) 100 MG capsule Stool Softener   ibuprofen (ADVIL,MOTRIN) 600 MG tablet Take 1 tablet (600 mg total) by mouth every 6 (six) hours. (Patient not taking: Reported on 09/15/2021)   Multiple Vitamin (MULTI-VITAMIN DAILY PO) Multi Vitamin   [DISCONTINUED] hydrOXYzine (ATARAX/VISTARIL) 25 MG tablet Take 25 mg by mouth every 6 (six) hours as needed for itching.  (Patient not taking: Reported on 09/15/2021)   [DISCONTINUED] Iron-Vitamin C (IRON 100/C) 100-250 MG TABS iron   [DISCONTINUED] Prenatal Vit-Fe Fumarate-FA (PRENATAL COMPLETE) 14-0.4 MG TABS Take 1 tablet by mouth daily. (Patient not taking: Reported on 09/15/2021)   No facility-administered encounter medications on file as of 09/15/2021.    Past Medical History:  Diagnosis Date   Anemia    Takes Iron supplement   Cholestasis of pregnancy in third trimester    Headache    relief with Tylenol   Seizures ([place]) 2008   Had seizure in 3rd month of first pregnancy in 2013   SVD (spontaneous vaginal delivery) 06/02/2018    Past Surgical History:  Procedure Laterality Date   WISDOM TOOTH EXTRACTION  2014    Family History  Problem  Relation Age of Onset   Hypertension Mother    Diabetes Maternal Grandmother     Social History   Socioeconomic History   Marital status: Single    Spouse name: Not on file   Number of children: Not on file   Years of education: Not on file   Highest education level: Not on file  Occupational History   Not on file  Tobacco Use   Smoking status: Never   Smokeless tobacco: Never  Vaping Use   Vaping Use: Never used  Substance and Sexual Activity   Alcohol use: Yes    Comment: occ   Drug use: No   Sexual activity: Yes    Partners: Male    Birth control/protection: None  Other Topics Concern   Not on file  Social History Narrative   Pt lives in single story home with her significant other and 2 children   Has 2 children - currently pregnant   11th grade education   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      Objective    BP 110/74   Pulse 91  Temp 98.1 F (36.7 C) (Oral)   Resp 16   Ht '5\' 1"'  (1.549 m)   Wt 145 lb 12.8 oz (66.1 kg)   SpO2 93%   BMI 27.55 kg/m   Physical Exam Vitals and nursing note reviewed.  Constitutional:      General: She is not in acute distress. HENT:     Head: Normocephalic and atraumatic.     Right Ear: Tympanic membrane, ear canal and external ear normal.     Left Ear: Tympanic membrane, ear canal and external ear normal.     Nose: Nose normal.     Mouth/Throat:     Mouth: Mucous membranes are moist.     Pharynx: Oropharynx is clear.  Eyes:     Conjunctiva/sclera: Conjunctivae normal.     Pupils: Pupils are equal, round, and reactive to light.  Neck:     Thyroid: No  thyromegaly.  Cardiovascular:     Rate and Rhythm: Normal rate and regular rhythm.     Heart sounds: Normal heart sounds. No murmur heard. Pulmonary:     Effort: Pulmonary effort is normal. No respiratory distress.     Breath sounds: Normal breath sounds.  Abdominal:     General: There is no distension.     Palpations: Abdomen is soft. There is no mass.     Tenderness: There is no abdominal tenderness.  Musculoskeletal:        General: Normal range of motion.     Cervical back: Normal range of motion and neck supple.  Skin:    General: Skin is warm and dry.  Neurological:     General: No focal deficit present.     Mental Status: She is alert and oriented to person, place, and time.  Psychiatric:        Mood and Affect: Mood normal.        Behavior: Behavior normal.        Assessment & Plan:   1. Annual physical exam Routine labs ordered - CMP14+EGFR - VITAMIN D 25 Hydroxy (Vit-D Deficiency, Fractures)  2. Screening for deficiency anemia  - CBC with Differential  3. Screening for lipid disorders  - Lipid Panel  4. Screening for endocrine/metabolic/immunity disorders  - TSH  5. Need for hepatitis C screening test  - Hepatitis C Antibody    No follow-ups on file.   Becky Sax, MD

## 2021-09-16 ENCOUNTER — Other Ambulatory Visit: Payer: Self-pay | Admitting: Family

## 2021-09-16 DIAGNOSIS — E559 Vitamin D deficiency, unspecified: Secondary | ICD-10-CM | POA: Insufficient documentation

## 2021-09-16 DIAGNOSIS — D72829 Elevated white blood cell count, unspecified: Secondary | ICD-10-CM

## 2021-09-16 LAB — TSH: TSH: 1.34 u[IU]/mL (ref 0.450–4.500)

## 2021-09-16 LAB — CMP14+EGFR
ALT: 53 IU/L — ABNORMAL HIGH (ref 0–32)
AST: 35 IU/L (ref 0–40)
Albumin/Globulin Ratio: 1.5 (ref 1.2–2.2)
Albumin: 4.6 g/dL (ref 3.9–5.0)
Alkaline Phosphatase: 158 IU/L — ABNORMAL HIGH (ref 44–121)
BUN/Creatinine Ratio: 13 (ref 9–23)
BUN: 8 mg/dL (ref 6–20)
Bilirubin Total: 0.4 mg/dL (ref 0.0–1.2)
CO2: 24 mmol/L (ref 20–29)
Calcium: 9.5 mg/dL (ref 8.7–10.2)
Chloride: 101 mmol/L (ref 96–106)
Creatinine, Ser: 0.64 mg/dL (ref 0.57–1.00)
Globulin, Total: 3.1 g/dL (ref 1.5–4.5)
Glucose: 80 mg/dL (ref 70–99)
Potassium: 4.3 mmol/L (ref 3.5–5.2)
Sodium: 139 mmol/L (ref 134–144)
Total Protein: 7.7 g/dL (ref 6.0–8.5)
eGFR: 125 mL/min/{1.73_m2} (ref >59)

## 2021-09-16 LAB — CBC WITH DIFFERENTIAL/PLATELET
Basophils Absolute: 0.1 10*3/uL (ref 0.0–0.2)
Basos: 1 %
EOS (ABSOLUTE): 0.4 10*3/uL (ref 0.0–0.4)
Eos: 5 %
Hematocrit: 39.2 % (ref 34.0–46.6)
Hemoglobin: 12.8 g/dL (ref 11.1–15.9)
Immature Grans (Abs): 0.1 10*3/uL (ref 0.0–0.1)
Immature Granulocytes: 1 %
Lymphocytes Absolute: 2.8 10*3/uL (ref 0.7–3.1)
Lymphs: 30 %
MCH: 27.1 pg (ref 26.6–33.0)
MCHC: 32.7 g/dL (ref 31.5–35.7)
MCV: 83 fL (ref 79–97)
Monocytes Absolute: 0.5 10*3/uL (ref 0.1–0.9)
Monocytes: 6 %
Neutrophils Absolute: 5.5 10*3/uL (ref 1.4–7.0)
Neutrophils: 57 %
Platelets: 267 10*3/uL (ref 150–450)
RBC: 4.73 x10E6/uL (ref 3.77–5.28)
RDW: 13.1 % (ref 11.7–15.4)
WBC: 9.4 10*3/uL (ref 3.4–10.8)

## 2021-09-16 LAB — HEPATITIS C ANTIBODY: Hep C Virus Ab: NONREACTIVE

## 2021-09-16 LAB — LIPID PANEL
Chol/HDL Ratio: 3.8 ratio (ref 0.0–4.4)
Cholesterol, Total: 150 mg/dL (ref 100–199)
HDL: 40 mg/dL (ref >39)
LDL Chol Calc (NIH): 84 mg/dL (ref 0–99)
Triglycerides: 150 mg/dL — ABNORMAL HIGH (ref 0–149)
VLDL Cholesterol Cal: 26 mg/dL (ref 5–40)

## 2021-09-16 LAB — VITAMIN D 25 HYDROXY (VIT D DEFICIENCY, FRACTURES): Vit D, 25-Hydroxy: 22.5 ng/mL — ABNORMAL LOW (ref 30.0–100.0)

## 2021-09-16 MED ORDER — VITAMIN D (ERGOCALCIFEROL) 1.25 MG (50000 UNIT) PO CAPS: 50000.0000 [IU] | ORAL_CAPSULE | ORAL | 2 refills | Status: DC

## 2021-09-17 ENCOUNTER — Encounter: Payer: Self-pay | Admitting: Family Medicine

## 2021-09-18 ENCOUNTER — Telehealth: Payer: Self-pay | Admitting: Hematology and Oncology

## 2021-09-18 ENCOUNTER — Encounter: Payer: Self-pay | Admitting: *Deleted

## 2021-09-18 ENCOUNTER — Other Ambulatory Visit: Payer: Self-pay | Admitting: *Deleted

## 2021-09-18 DIAGNOSIS — E559 Vitamin D deficiency, unspecified: Secondary | ICD-10-CM

## 2021-09-18 DIAGNOSIS — Z13220 Encounter for screening for lipoid disorders: Secondary | ICD-10-CM

## 2021-09-18 NOTE — Telephone Encounter (Signed)
Scheduled appt per 6/3 referral. Pt is aware of appt date and time. Pt is aware to arrive 15 mins prior to appt time and to bring and updated insurance card. Pt is aware of appt location.   

## 2021-10-02 ENCOUNTER — Telehealth: Payer: Self-pay | Admitting: Hematology and Oncology

## 2021-10-02 ENCOUNTER — Inpatient Hospital Stay: Payer: Medicaid Other | Admitting: Hematology and Oncology

## 2021-10-02 NOTE — Telephone Encounter (Signed)
Cancelled pt's new hem appt per provider request. Spoke to pt to let her know appt was cancelled per provider's review of her referral. I let her know the nurse would reach out to her for further explanation. She verbalized understanding.

## 2021-10-15 ENCOUNTER — Encounter (HOSPITAL_COMMUNITY): Payer: Self-pay | Admitting: Emergency Medicine

## 2021-10-15 ENCOUNTER — Ambulatory Visit (HOSPITAL_COMMUNITY)
Admission: EM | Admit: 2021-10-15 | Discharge: 2021-10-16 | Disposition: A | Discharge status: Home / Self Care | Payer: Medicaid Other | Attending: Emergency Medicine | Admitting: Emergency Medicine

## 2021-10-15 ENCOUNTER — Other Ambulatory Visit (HOSPITAL_COMMUNITY): Payer: Medicaid Other

## 2021-10-15 ENCOUNTER — Emergency Department (EMERGENCY_DEPARTMENT_HOSPITAL): Payer: Medicaid Other | Admitting: Certified Registered Nurse Anesthetist

## 2021-10-15 ENCOUNTER — Encounter (HOSPITAL_COMMUNITY)
Admission: EM | Disposition: A | Discharge status: Home / Self Care | Payer: Self-pay | Source: Home / Self Care | Attending: Emergency Medicine

## 2021-10-15 ENCOUNTER — Other Ambulatory Visit: Payer: Self-pay

## 2021-10-15 ENCOUNTER — Emergency Department (HOSPITAL_COMMUNITY): Payer: Medicaid Other | Admitting: Certified Registered Nurse Anesthetist

## 2021-10-15 ENCOUNTER — Other Ambulatory Visit (HOSPITAL_COMMUNITY): Payer: Self-pay

## 2021-10-15 ENCOUNTER — Emergency Department (HOSPITAL_COMMUNITY): Payer: Medicaid Other

## 2021-10-15 DIAGNOSIS — N83201 Unspecified ovarian cyst, right side: Secondary | ICD-10-CM

## 2021-10-15 DIAGNOSIS — N83511 Torsion of right ovary and ovarian pedicle: Secondary | ICD-10-CM

## 2021-10-15 DIAGNOSIS — R102 Pelvic and perineal pain unspecified side: Secondary | ICD-10-CM

## 2021-10-15 DIAGNOSIS — N83519 Torsion of ovary and ovarian pedicle, unspecified side: Secondary | ICD-10-CM

## 2021-10-15 HISTORY — PX: LAPAROSCOPIC SALPINGO OOPHERECTOMY: SHX5927

## 2021-10-15 HISTORY — DX: Torsion of ovary and ovarian pedicle, unspecified side: N83.519

## 2021-10-15 LAB — CBC
HCT: 38.1 % (ref 36.0–46.0)
Hemoglobin: 11.5 g/dL — ABNORMAL LOW (ref 12.0–15.0)
MCH: 26.2 pg (ref 26.0–34.0)
MCHC: 30.2 g/dL (ref 30.0–36.0)
MCV: 86.8 fL (ref 80.0–100.0)
Platelets: 204 10*3/uL (ref 150–400)
RBC: 4.39 MIL/uL (ref 3.87–5.11)
RDW: 13.3 % (ref 11.5–15.5)
WBC: 10.3 10*3/uL (ref 4.0–10.5)
nRBC: 0 % (ref 0.0–0.2)

## 2021-10-15 LAB — URINALYSIS, ROUTINE W REFLEX MICROSCOPIC
Bilirubin Urine: NEGATIVE
Glucose, UA: NEGATIVE mg/dL
Hgb urine dipstick: NEGATIVE
Ketones, ur: NEGATIVE mg/dL
Nitrite: NEGATIVE
Protein, ur: NEGATIVE mg/dL
Specific Gravity, Urine: 1.011 (ref 1.005–1.030)
Squamous Epithelial / HPF: >50 — ABNORMAL HIGH (ref 0–5)
pH: 7 (ref 5.0–8.0)

## 2021-10-15 LAB — COMPREHENSIVE METABOLIC PANEL
ALT: 16 U/L (ref 0–44)
AST: 21 U/L (ref 15–41)
Albumin: 3.6 g/dL (ref 3.5–5.0)
Alkaline Phosphatase: 62 U/L (ref 38–126)
Anion gap: 6 (ref 5–15)
BUN: 8 mg/dL (ref 6–20)
CO2: 21 mmol/L — ABNORMAL LOW (ref 22–32)
Calcium: 8 mg/dL — ABNORMAL LOW (ref 8.9–10.3)
Chloride: 112 mmol/L — ABNORMAL HIGH (ref 98–111)
Creatinine, Ser: 0.8 mg/dL (ref 0.44–1.00)
GFR, Estimated: >60 mL/min (ref >60)
Glucose, Bld: 108 mg/dL — ABNORMAL HIGH (ref 70–99)
Potassium: 3.9 mmol/L (ref 3.5–5.1)
Sodium: 139 mmol/L (ref 135–145)
Total Bilirubin: 0.5 mg/dL (ref 0.3–1.2)
Total Protein: 6.5 g/dL (ref 6.5–8.1)

## 2021-10-15 LAB — I-STAT CHEM 8, ED
BUN: 9 mg/dL (ref 6–20)
Calcium, Ion: 1.09 mmol/L — ABNORMAL LOW (ref 1.15–1.40)
Chloride: 107 mmol/L (ref 98–111)
Creatinine, Ser: 0.6 mg/dL (ref 0.44–1.00)
Glucose, Bld: 108 mg/dL — ABNORMAL HIGH (ref 70–99)
HCT: 35 % — ABNORMAL LOW (ref 36.0–46.0)
Hemoglobin: 11.9 g/dL — ABNORMAL LOW (ref 12.0–15.0)
Potassium: 4 mmol/L (ref 3.5–5.1)
Sodium: 142 mmol/L (ref 135–145)
TCO2: 22 mmol/L (ref 22–32)

## 2021-10-15 LAB — TYPE AND SCREEN
ABO/RH(D): O POS
Antibody Screen: NEGATIVE

## 2021-10-15 LAB — I-STAT BETA HCG BLOOD, ED (MC, WL, AP ONLY): I-stat hCG, quantitative: <5 m[IU]/mL (ref <5)

## 2021-10-15 LAB — HCG, QUANTITATIVE, PREGNANCY: hCG, Beta Chain, Quant, S: <1 m[IU]/mL (ref <5)

## 2021-10-15 SURGERY — SALPINGO-OOPHORECTOMY, LAPAROSCOPIC
Anesthesia: General | Laterality: Right

## 2021-10-15 MED ORDER — DEXAMETHASONE SODIUM PHOSPHATE 4 MG/ML IJ SOLN
INTRAMUSCULAR | Status: DC | PRN
  Administered 2021-10-15: 10 mg via INTRAVENOUS

## 2021-10-15 MED ORDER — HYDROMORPHONE HCL 1 MG/ML IJ SOLN
0.5000 mg | Freq: Once | INTRAMUSCULAR | Status: AC
  Administered 2021-10-15: 0.5 mg via INTRAVENOUS
  Filled 2021-10-15: qty 1

## 2021-10-15 MED ORDER — ROCURONIUM BROMIDE 100 MG/10ML IV SOLN
INTRAVENOUS | Status: DC | PRN
  Administered 2021-10-15: 50 mg via INTRAVENOUS

## 2021-10-15 MED ORDER — MIDAZOLAM HCL 5 MG/5ML IJ SOLN
INTRAMUSCULAR | Status: DC | PRN
  Administered 2021-10-15: 2 mg via INTRAVENOUS

## 2021-10-15 MED ORDER — FENTANYL CITRATE (PF) 250 MCG/5ML IJ SOLN
INTRAMUSCULAR | Status: AC
  Filled 2021-10-15: qty 5

## 2021-10-15 MED ORDER — MIDAZOLAM HCL 2 MG/2ML IJ SOLN
INTRAMUSCULAR | Status: AC
  Filled 2021-10-15: qty 2

## 2021-10-15 MED ORDER — LIDOCAINE HCL (CARDIAC) PF 50 MG/5ML IV SOSY
PREFILLED_SYRINGE | INTRAVENOUS | Status: DC | PRN
  Administered 2021-10-15: 60 mg via INTRAVENOUS

## 2021-10-15 MED ORDER — FENTANYL CITRATE (PF) 100 MCG/2ML IJ SOLN
INTRAMUSCULAR | Status: DC | PRN
  Administered 2021-10-15: 100 ug via INTRAVENOUS
  Administered 2021-10-16 (×2): 50 ug via INTRAVENOUS

## 2021-10-15 MED ORDER — PROPOFOL 10 MG/ML IV BOLUS
INTRAVENOUS | Status: AC
  Filled 2021-10-15: qty 20

## 2021-10-15 MED ORDER — HYDROMORPHONE HCL 1 MG/ML IJ SOLN
1.0000 mg | INTRAMUSCULAR | Status: DC | PRN
  Administered 2021-10-15: 1 mg via INTRAVENOUS
  Filled 2021-10-15: qty 1

## 2021-10-15 MED ORDER — PROPOFOL 10 MG/ML IV BOLUS
INTRAVENOUS | Status: DC | PRN
  Administered 2021-10-15: 150 mg via INTRAVENOUS

## 2021-10-15 MED ORDER — LACTATED RINGERS IV SOLN: INTRAVENOUS | Status: DC | PRN

## 2021-10-15 MED ORDER — PHENYLEPHRINE 80 MCG/ML (10ML) SYRINGE FOR IV PUSH (FOR BLOOD PRESSURE SUPPORT)
PREFILLED_SYRINGE | INTRAVENOUS | Status: DC | PRN
  Administered 2021-10-15: 80 ug via INTRAVENOUS
  Administered 2021-10-15 – 2021-10-16 (×3): 160 ug via INTRAVENOUS

## 2021-10-15 SURGICAL SUPPLY — 44 items
ADH SKN CLS APL DERMABOND .7 (GAUZE/BANDAGES/DRESSINGS) ×1
APL SRG 38 LTWT LNG FL B (MISCELLANEOUS) ×1
APPLICATOR ARISTA FLEXITIP XL (MISCELLANEOUS) ×1 IMPLANT
BAG COUNTER SPONGE SURGICOUNT (BAG) ×2 IMPLANT
BAG SPEC RTRVL LRG 6X4 10 (ENDOMECHANICALS) ×1
BAG SPNG CNTER NS LX DISP (BAG) ×1
BLADE SURG 10 STRL SS (BLADE) ×1 IMPLANT
CABLE HIGH FREQUENCY MONO STRZ (ELECTRODE) IMPLANT
DERMABOND ADVANCED (GAUZE/BANDAGES/DRESSINGS) ×1
DERMABOND ADVANCED .7 DNX12 (GAUZE/BANDAGES/DRESSINGS) ×1 IMPLANT
DRSG OPSITE POSTOP 3X4 (GAUZE/BANDAGES/DRESSINGS) ×1 IMPLANT
DURAPREP 26ML APPLICATOR (WOUND CARE) ×2 IMPLANT
GLOVE BIO SURGEON STRL SZ 6.5 (GLOVE) ×2 IMPLANT
GLOVE BIOGEL PI IND STRL 7.0 (GLOVE) ×1 IMPLANT
GLOVE BIOGEL PI INDICATOR 7.0 (GLOVE) ×1
GOWN STRL REUS W/ TWL LRG LVL3 (GOWN DISPOSABLE) ×2 IMPLANT
GOWN STRL REUS W/TWL LRG LVL3 (GOWN DISPOSABLE) ×4
HEMOSTAT ARISTA ABSORB 3G PWDR (HEMOSTASIS) ×1 IMPLANT
IRRIG SUCT STRYKERFLOW 2 WTIP (MISCELLANEOUS) ×2
IRRIGATION SUCT STRKRFLW 2 WTP (MISCELLANEOUS) IMPLANT
KIT TURNOVER KIT B (KITS) ×2 IMPLANT
NDL INSUFFLATION 14GA 120MM (NEEDLE) ×1 IMPLANT
NEEDLE INSUFFLATION 14GA 120MM (NEEDLE) IMPLANT
NS IRRIG 1000ML POUR BTL (IV SOLUTION) ×2 IMPLANT
PACK LAPAROSCOPY BASIN (CUSTOM PROCEDURE TRAY) ×2 IMPLANT
PACK TRENDGUARD 450 HYBRID PRO (MISCELLANEOUS) IMPLANT
POUCH ENDO CATCH II 15MM (MISCELLANEOUS) ×1 IMPLANT
POUCH SPECIMEN RETRIEVAL 10MM (ENDOMECHANICALS) ×1 IMPLANT
PROTECTOR NERVE ULNAR (MISCELLANEOUS) ×4 IMPLANT
SET TUBE SMOKE EVAC HIGH FLOW (TUBING) ×2 IMPLANT
SHEARS HARMONIC ACE PLUS 36CM (ENDOMECHANICALS) ×1 IMPLANT
SLEEVE ENDOPATH XCEL 5M (ENDOMECHANICALS) ×2 IMPLANT
SUT VIC AB 3-0 CT1 27 (SUTURE) ×2
SUT VIC AB 3-0 CT1 TAPERPNT 27 (SUTURE) IMPLANT
SUT VICRYL 0 UR6 27IN ABS (SUTURE) ×2 IMPLANT
SUT VICRYL 4-0 PS2 18IN ABS (SUTURE) ×2 IMPLANT
SYSTEM CARTER THOMASON II (TROCAR) ×1 IMPLANT
TOWEL GREEN STERILE FF (TOWEL DISPOSABLE) ×4 IMPLANT
TRAY FOLEY BAG SILVER LF 14FR (CATHETERS) ×2 IMPLANT
TRENDGUARD 450 HYBRID PRO PACK (MISCELLANEOUS) ×2
TROCAR BLADELESS 15MM (ENDOMECHANICALS) ×1 IMPLANT
TROCAR XCEL NON-BLD 11X100MML (ENDOMECHANICALS) IMPLANT
TROCAR XCEL NON-BLD 5MMX100MML (ENDOMECHANICALS) ×2 IMPLANT
WARMER LAPAROSCOPE (MISCELLANEOUS) ×2 IMPLANT

## 2021-10-15 NOTE — ED Notes (Signed)
Pt states she is having difficulty urinating.

## 2021-10-15 NOTE — Interval H&P Note (Signed)
History and Physical Interval Note:  10/15/2021 11:25 PM  Brandy Sanders  has presented today for surgery, with the diagnosis of TORSION, RIGHT OVARY.  The various methods of treatment have been discussed with the patient and family. After consideration of risks, benefits and other options for treatment, the patient has consented to  Procedure(s): LAPAROSCOPY DIAGNOSTIC, POSSIBLE OOPHORECTOMY, POSSIBLE CYSTECTOMY (Right) as a surgical intervention.  The patient's history has been reviewed, patient examined, no change in status, stable for surgery.  I have reviewed the patient's chart and labs.  Questions were answered to the patient's satisfaction.     Benetta Maclaren Bovard-Stuckert

## 2021-10-15 NOTE — ED Notes (Signed)
Provider at bedside

## 2021-10-15 NOTE — ED Provider Notes (Signed)
[facility] EMERGENCY DEPARTMENT Provider Note   CSN: [id number] Arrival date & time: 10/15/21  1652     History  Chief Complaint  Patient presents with   Abdominal Cramping    Brandy Sanders is a 26 y.o. female.  Pt c/o acute onset right lower abd pain this AM. Was mild at onset, but now severe. Constant, dull, non radiating. No hx same pain.  EMS noted low bp in route. Pt is sexually active. Last period ~ 1 month ago. Denies vaginal discharge or bleeding. No fever or chills. No hematuria or dysuria. No hx kidney stones.   The history is provided by the patient, medical records and the EMS personnel.  Abdominal Cramping Associated symptoms include abdominal pain. Pertinent negatives include no chest pain and no shortness of breath.       Home Medications Prior to Admission medications   Medication Sig Start Date End Date Taking? Authorizing Provider  acetaminophen (TYLENOL) 325 MG tablet Take 2 tablets (650 mg total) by mouth every 4 (four) hours as needed (for pain scale < 4). Patient not taking: Reported on 09/15/2021 06/04/18   Huel Cote, MD  cetirizine (ZYRTEC) 10 MG chewable tablet Zyrtec    [provider]  cyclobenzaprine (FLEXERIL) 10 MG tablet Take 1 tablet (10 mg total) by mouth 2 (two) times daily as needed for muscle spasms. Patient not taking: Reported on 09/15/2021 02/06/21   Georganna Skeans, MD  docusate sodium (STOOL SOFTENER) 100 MG capsule Stool Softener    [provider]  ibuprofen (ADVIL,MOTRIN) 600 MG tablet Take 1 tablet (600 mg total) by mouth every 6 (six) hours. Patient not taking: Reported on 09/15/2021 06/04/18   Huel Cote, MD  Multiple Vitamin (MULTI-VITAMIN DAILY PO) Multi Vitamin    [provider]  Vitamin D, Ergocalciferol, (DRISDOL) 1.25 MG (50000 UNIT) CAPS capsule Take 1 capsule (50,000 Units total) by mouth every 7 (seven) days for 12 doses. 09/16/21 12/03/21  Rema Fendt, NP       Allergies    Patient has no known allergies.    Review of Systems   Review of Systems  Constitutional:  Negative for chills and fever.  Respiratory:  Negative for shortness of breath.   Cardiovascular:  Negative for chest pain.  Gastrointestinal:  Positive for abdominal pain.  Genitourinary:  Negative for dysuria, hematuria, vaginal bleeding and vaginal discharge.  Skin:  Negative for rash.  Neurological:  Negative for light-headedness.  Hematological:  Does not bruise/bleed easily.    Physical Exam Updated Vital Signs BP (!) 103/59 (BP Location: Right Arm)   Pulse 76   Temp (!) 97.5 F (36.4 C) (Oral)   Resp 20   SpO2 100%  Physical Exam Vitals and nursing note reviewed.  Constitutional:      Appearance: Normal appearance. She is well-developed.     Comments: In pain  HENT:     Head: Atraumatic.     Nose: Nose normal.     Mouth/Throat:     Mouth: Mucous membranes are moist.  Eyes:     General: No scleral icterus.    Conjunctiva/sclera: Conjunctivae normal.  Neck:     Trachea: No tracheal deviation.  Cardiovascular:     Rate and Rhythm: Normal rate and regular rhythm.     Pulses: Normal pulses.     Heart sounds: Normal heart sounds. No murmur heard.    No friction rub. No gallop.  Pulmonary:     Effort: Pulmonary effort is normal.  No respiratory distress.     Breath sounds: Normal breath sounds.  Abdominal:     General: Bowel sounds are normal. There is no distension.     Palpations: Abdomen is soft.     Tenderness: There is abdominal tenderness. There is no guarding.     Comments: Lower abd tenderness, esp right.  Genitourinary:    Comments: No cva tenderness.  Musculoskeletal:        General: No swelling.     Cervical back: Normal range of motion and neck supple. No rigidity. No muscular tenderness.  Skin:    General: Skin is warm and dry.     Findings: No rash.  Neurological:     Mental Status: She is alert.     Comments: Alert, speech normal.    Psychiatric:     Comments: Anxious appearing, in pain.      ED Results / Procedures / Treatments   Labs (all labs ordered are listed, but only abnormal results are displayed) Results for orders placed or performed during the hospital encounter of 10/15/21  hCG, quantitative, pregnancy  Result Value Ref Range   hCG, Beta Chain, Quant, S <1 <5 mIU/mL  CBC  Result Value Ref Range   WBC 10.3 4.0 - 10.5 K/uL   RBC 4.39 3.87 - 5.11 MIL/uL   Hemoglobin 11.5 (L) 12.0 - 15.0 g/dL   HCT 25.4 27.0 - 62.3 %   MCV 86.8 80.0 - 100.0 fL   MCH 26.2 26.0 - 34.0 pg   MCHC 30.2 30.0 - 36.0 g/dL   RDW 76.2 83.1 - 51.7 %   Platelets 204 150 - 400 K/uL   nRBC 0.0 0.0 - 0.2 %  I-stat chem 8, ED  Result Value Ref Range   Sodium 142 135 - 145 mmol/L   Potassium 4.0 3.5 - 5.1 mmol/L   Chloride 107 98 - 111 mmol/L   BUN 9 6 - 20 mg/dL   Creatinine, Ser 6.16 0.44 - 1.00 mg/dL   Glucose, Bld 073 (H) 70 - 99 mg/dL   Calcium, Ion 7.10 (L) 1.15 - 1.40 mmol/L   TCO2 22 22 - 32 mmol/L   Hemoglobin 11.9 (L) 12.0 - 15.0 g/dL   HCT 62.6 (L) 94.8 - 54.6 %  I-Stat beta hCG blood, ED  Result Value Ref Range   I-stat hCG, quantitative <5.0 <5 mIU/mL   Comment 3          Type and screen  Result Value Ref Range   ABO/RH(D) O POS    Antibody Screen NEG    Sample Expiration      10/18/2021,2359 Performed at [facility], [address] [place], [place] [address]       EKG None  Radiology US PELVIC COMPLETE W TRANSVAGINAL AND TORSION R/O  Result Date: 10/15/2021 CLINICAL DATA:  Severe pelvic pain. EXAM: TRANSABDOMINAL ULTRASOUND OF PELVIS DOPPLER ULTRASOUND OF OVARIES TECHNIQUE: Transabdominal ultrasound examination of the pelvis was performed including evaluation of the uterus, ovaries, adnexal regions, and pelvic cul-de-sac. Color and duplex Doppler ultrasound was utilized to evaluate blood flow to the ovaries. COMPARISON:  None Available. FINDINGS: Uterus Measurements: 11.1 x 4.1 x 7.2 cm =  volume: 168 mL. No fibroids or other mass visualized. Uterus is anteverted. Endometrium Thickness: 11.3 mm, within normal limits. No focal abnormality visualized. Right ovary Measurements: 9.5 x 6.8 x 7.6 cm = volume: 256 mL. A simple cyst measures 5.6 x 5.5 x 5.6 cm. Left ovary Measurements: 3.6 x  1.9 x 3.5 cm = volume: 12.5 mL. Normal appearance/no adnexal mass. Pulsed Doppler evaluation demonstrates normal low-resistance arterial and venous waveforms in the left ovary. Venous waveforms are identified in the right ovary. Arterial waveforms are less well visualized on the right. Color Doppler is normal on the right. IMPRESSION: 1. Incomplete visualization of arterial waveforms in the right ovary. Venous waveforms are normal. Torsion is therefore not excluded. 2. Prominent right adnexal cyst. Recommend follow-up US in 3-6 months. Note: This recommendation does not apply to premenarchal patients or to those with increased risk (genetic, family history, elevated tumor markers or other high-risk factors) of ovarian cancer. Reference: Radiology 2019 Nov; 293(2):359-371. 3. Normal sonographic appearance of the uterus and left ovary. Critical Value/emergent results were called by telephone at the time of interpretation on 10/15/2021 at 6:26 pm to provider [facility] , who verbally acknowledged these results. Electronically Signed   By: Marin Roberts M.D.   On: 10/15/2021 18:27    Procedures Procedures    Medications Ordered in ED Medications  HYDROmorphone (DILAUDID) injection 0.5 mg (has no administration in time range)    ED Course/ Medical Decision Making/ A&P                           Medical Decision Making Problems Addressed: Pelvic pain: acute illness or injury with systemic symptoms that poses a threat to life or bodily functions Right ovarian cyst: acute illness or injury with systemic symptoms that poses a threat to life or bodily functions Torsion of right ovary and ovarian pedicle:  acute illness or injury with systemic symptoms that poses a threat to life or bodily functions  Amount and/or Complexity of Data Reviewed Independent Historian: EMS    Details: hx External Data Reviewed: notes. Labs: ordered. Decision-making details documented in ED Course. Radiology: ordered and independent interpretation performed. Decision-making details documented in ED Course. ECG/medicine tests: ordered and independent interpretation performed. Decision-making details documented in ED Course.  Risk Prescription drug management.   Iv ns. Continuous pulse ox and cardiac monitoring. Stat labs and u/s ordered.   Reviewed nursing notes and prior charts for additional history. External reports reviewed. Additional history from: EMS.   Cardiac monitor: sinus rhythm, rate 84.  Labs reviewed/interpreted by me - preg neg.   U/S reviewed/interpreted by me - large right ovarian cyst without appreciable arterial flow  Dilaudid iv.   U/s report called to me at 1826 concerning for torsion. 1827, ob/gyn consulted (pt initially had indicated did not have her own ob/gyn doctor or group) - ob/gyn on call notes recent Epic note, Dr Ellyn Hack from 06/2021 - discussed w pt, pt now indicates has gone to that practice. On call provider for [facility] called at 1835, discussed pt, will see in ED.   Pain improved w meds. Discussed u/s.  CRITICAL CARE RE: ovarian torsion Performed by: Suzi Roots Total critical care time: 45 minutes Critical care time was exclusive of separately billable procedures and treating other patients. Critical care was necessary to treat or prevent imminent or life-threatening deterioration. Critical care was time spent personally by me on the following activities: development of treatment plan with patient and/or surrogate as well as nursing, discussions with consultants, evaluation of patient's response to treatment, examination of patient, obtaining history from patient or  surrogate, ordering and performing treatments and interventions, ordering and review of laboratory studies, ordering and review of radiographic studies, pulse oximetry and re-evaluation of patient's condition.  Final Clinical Impression(s) / ED Diagnoses Final diagnoses:  None    Rx / DC Orders ED Discharge Orders     None         Cathren Laine, MD 10/15/21 [phone]

## 2021-10-15 NOTE — Anesthesia Procedure Notes (Signed)
Procedure Name: Intubation Date/Time: 10/15/2021 11:38 PM  Performed by: Adair Laundry, CRNAPre-anesthesia Checklist: Patient identified, Emergency Drugs available, Suction available and Patient being monitored Patient Re-evaluated:Patient Re-evaluated prior to induction Oxygen Delivery Method: Circle system utilized Preoxygenation: Pre-oxygenation with 100% oxygen Induction Type: IV induction Ventilation: Mask ventilation without difficulty Laryngoscope Size: Miller and 2 Grade View: Grade I Tube type: Oral Tube size: 7.5 mm Number of attempts: 1 Airway Equipment and Method: Stylet Placement Confirmation: ETT inserted through vocal cords under direct vision, positive ETCO2 and breath sounds checked- equal and bilateral Secured at: 22 cm Tube secured with: Tape Dental Injury: Teeth and Oropharynx as per pre-operative assessment

## 2021-10-15 NOTE — ED Triage Notes (Signed)
EMS reports that the patient was having back pain. In transport the patient "lost peripheral pulses" and went from an initial pressure of 114/82 to 60/30 manual BP. Patient cool and diaphoretic. Patient's HR maintains in the 80 per EMS. Patient has hx of herniated disk. Per EMS patient has been having unprotected sex and no birth control, no bleeding or spotting. Patient woke up with severe lower abd. & flank pain on the right side.

## 2021-10-15 NOTE — Interval H&P Note (Signed)
History and Physical Interval Note:  10/15/2021 11:26 PM  Brandy Sanders  has presented today for surgery, with the diagnosis of TORSION, RIGHT OVARY.  The various methods of treatment have been discussed with the patient and family. After consideration of risks, benefits and other options for treatment, the patient has consented to  Procedure(s): LAPAROSCOPY DIAGNOSTIC, POSSIBLE OOPHORECTOMY, POSSIBLE CYSTECTOMY (Right) as a surgical intervention.  The patient's history has been reviewed, patient examined, no change in status, stable for surgery.  I have reviewed the patient's chart and labs.  Questions were answered to the patient's satisfaction.     Velmer Woelfel Bovard-Stuckert

## 2021-10-15 NOTE — H&P (Signed)
Brandy Sanders is an 26 y.o. female [phone] with torsed ovary by [place].  No arterial flow and R ovarian cyst (5.6 x 5.5 x 5.6cm).  Pt with R hip and back pain in morning, much increased in late afternoon, by EMS to ED.  HCG neg.  States very nauseous, no vomiting.  No fever or chills.  D/W pt diagnosis of Right ovarian torsion - d/w pt r/b/a of surgery - operative laparoscopy and possible R salpingoopherectomy, possible R cystectomy.  D/w pt post operative expectations and limitations.    Pertinent Gynecological History: [id number] TAB x 1; SVD x 3,  G1 37wk SVD M, had seizure G2 36wk SVD M G3 TAB G4 37wk SVD F Had cholestasis with all pregnancy  No contraception No STD No abn pap, last 06/27/21  Menstrual History:  No LMP recorded.    Past Medical History:  Diagnosis Date   Anemia    Takes Iron supplement   Cholestasis of pregnancy in third trimester    Headache    relief with Tylenol   Ovarian torsion 10/15/2021   Seizures ([place]) 2008   Had seizure in 3rd month of first pregnancy in 2013   SVD (spontaneous vaginal delivery) 06/02/2018    Past Surgical History:  Procedure Laterality Date   WISDOM TOOTH EXTRACTION  2014    Family History  Problem Relation Age of Onset   Hypertension Mother    Diabetes Maternal Grandmother   CVA - mother  Social History:  reports that she has never smoked. She has never used smokeless tobacco. She reports current alcohol use. She reports that she does not use drugs. She is divorced and engaged.  Is SAHM  Allergies: No Known Allergies  (Not in a hospital admission)   Review of Systems  Constitutional:  Positive for activity change.  HENT: Negative.    Respiratory: Negative.    Cardiovascular: Negative.   Gastrointestinal:  Positive for nausea.  Genitourinary:  Positive for pelvic pain.  Musculoskeletal:  Positive for back pain.  Neurological: Negative.   Psychiatric/Behavioral: Negative.      Blood pressure 128/81, pulse 94,  temperature (!) 97.5 F (36.4 C), temperature source Oral, resp. rate 18, height 5\' 1"  (1.549 m), weight 66.1 kg, SpO2 99 %, unknown if currently breastfeeding. Physical Exam Constitutional:      General: She is in acute distress.  HENT:     Head: Normocephalic and atraumatic.  Cardiovascular:     Rate and Rhythm: Normal rate and regular rhythm.  Pulmonary:     Effort: Pulmonary effort is normal.     Breath sounds: Normal breath sounds.  Abdominal:     Tenderness: There is abdominal tenderness. There is guarding.  Musculoskeletal:        General: Normal range of motion.     Cervical back: Normal range of motion and neck supple.  Skin:    General: Skin is warm and dry.  Neurological:     General: No focal deficit present.     Mental Status: She is alert and oriented to person, place, and time.  Psychiatric:        Mood and Affect: Mood normal.        Behavior: Behavior normal.     Results for orders placed or performed during the hospital encounter of 10/15/21 (from the past 24 hour(s))  Type and screen     Status: None   Collection Time: 10/15/21  5:07 PM  Result Value Ref Range   ABO/RH(D) O POS  Antibody Screen NEG    Sample Expiration      10/18/2021,2359 Performed at [facility], [address] [place], [place] [address]   Comprehensive metabolic panel     Status: Abnormal   Collection Time: 10/15/21  5:07 PM  Result Value Ref Range   Sodium 139 135 - 145 mmol/L   Potassium 3.9 3.5 - 5.1 mmol/L   Chloride 112 (H) 98 - 111 mmol/L   CO2 21 (L) 22 - 32 mmol/L   Glucose, Bld 108 (H) 70 - 99 mg/dL   BUN 8 6 - 20 mg/dL   Creatinine, Ser 8.46 0.44 - 1.00 mg/dL   Calcium 8.0 (L) 8.9 - 10.3 mg/dL   Total Protein 6.5 6.5 - 8.1 g/dL   Albumin 3.6 3.5 - 5.0 g/dL   AST 21 15 - 41 U/L   ALT 16 0 - 44 U/L   Alkaline Phosphatase 62 38 - 126 U/L   Total Bilirubin 0.5 0.3 - 1.2 mg/dL   GFR, Estimated >96 >29 mL/min   Anion gap 6 5 - 15  CBC     Status: Abnormal    Collection Time: 10/15/21  5:07 PM  Result Value Ref Range   WBC 10.3 4.0 - 10.5 K/uL   RBC 4.39 3.87 - 5.11 MIL/uL   Hemoglobin 11.5 (L) 12.0 - 15.0 g/dL   HCT 52.8 41.3 - 24.4 %   MCV 86.8 80.0 - 100.0 fL   MCH 26.2 26.0 - 34.0 pg   MCHC 30.2 30.0 - 36.0 g/dL   RDW 01.0 27.2 - 53.6 %   Platelets 204 150 - 400 K/uL   nRBC 0.0 0.0 - 0.2 %  I-Stat beta hCG blood, ED     Status: None   Collection Time: 10/15/21  5:11 PM  Result Value Ref Range   I-stat hCG, quantitative <5.0 <5 mIU/mL   Comment 3          I-stat chem 8, ED     Status: Abnormal   Collection Time: 10/15/21  5:13 PM  Result Value Ref Range   Sodium 142 135 - 145 mmol/L   Potassium 4.0 3.5 - 5.1 mmol/L   Chloride 107 98 - 111 mmol/L   BUN 9 6 - 20 mg/dL   Creatinine, Ser 6.44 0.44 - 1.00 mg/dL   Glucose, Bld 034 (H) 70 - 99 mg/dL   Calcium, Ion 7.42 (L) 1.15 - 1.40 mmol/L   TCO2 22 22 - 32 mmol/L   Hemoglobin 11.9 (L) 12.0 - 15.0 g/dL   HCT 59.5 (L) 63.8 - 75.6 %  Urinalysis, Routine w reflex microscopic     Status: Abnormal   Collection Time: 10/15/21  5:28 PM  Result Value Ref Range   Color, Urine YELLOW (A) YELLOW   APPearance CLOUDY (A) CLEAR   Specific Gravity, Urine 1.011 1.005 - 1.030   pH 7.0 5.0 - 8.0   Glucose, UA NEGATIVE NEGATIVE mg/dL   Hgb urine dipstick NEGATIVE NEGATIVE   Bilirubin Urine NEGATIVE NEGATIVE   Ketones, ur NEGATIVE NEGATIVE mg/dL   Protein, ur NEGATIVE NEGATIVE mg/dL   Nitrite NEGATIVE NEGATIVE   Leukocytes,Ua LARGE (A) NEGATIVE   RBC / HPF 0-5 0 - 5 RBC/hpf   WBC, UA 21-50 0 - 5 WBC/hpf   Bacteria, UA FEW (A) NONE SEEN   Squamous Epithelial / LPF >50 (H) 0 - 5  hCG, quantitative, pregnancy     Status: None   Collection Time: 10/15/21  5:30  PM  Result Value Ref Range   hCG, Beta Chain, Quant, S <1 <5 mIU/mL    US PELVIC COMPLETE W TRANSVAGINAL AND TORSION R/O  Result Date: 10/15/2021 CLINICAL DATA:  Severe pelvic pain. EXAM: TRANSABDOMINAL ULTRASOUND OF PELVIS DOPPLER  ULTRASOUND OF OVARIES TECHNIQUE: Transabdominal ultrasound examination of the pelvis was performed including evaluation of the uterus, ovaries, adnexal regions, and pelvic cul-de-sac. Color and duplex Doppler ultrasound was utilized to evaluate blood flow to the ovaries. COMPARISON:  None Available. FINDINGS: Uterus Measurements: 11.1 x 4.1 x 7.2 cm = volume: 168 mL. No fibroids or other mass visualized. Uterus is anteverted. Endometrium Thickness: 11.3 mm, within normal limits. No focal abnormality visualized. Right ovary Measurements: 9.5 x 6.8 x 7.6 cm = volume: 256 mL. A simple cyst measures 5.6 x 5.5 x 5.6 cm. Left ovary Measurements: 3.6 x 1.9 x 3.5 cm = volume: 12.5 mL. Normal appearance/no adnexal mass. Pulsed Doppler evaluation demonstrates normal low-resistance arterial and venous waveforms in the left ovary. Venous waveforms are identified in the right ovary. Arterial waveforms are less well visualized on the right. Color Doppler is normal on the right. IMPRESSION: 1. Incomplete visualization of arterial waveforms in the right ovary. Venous waveforms are normal. Torsion is therefore not excluded. 2. Prominent right adnexal cyst. Recommend follow-up US in 3-6 months. Note: This recommendation does not apply to premenarchal patients or to those with increased risk (genetic, family history, elevated tumor markers or other high-risk factors) of ovarian cancer. Reference: Radiology 2019 Nov; 293(2):359-371. 3. Normal sonographic appearance of the uterus and left ovary. Critical Value/emergent results were called by telephone at the time of interpretation on 10/15/2021 at 6:26 pm to provider [facility] , who verbally acknowledged these results. Electronically Signed   By: Marin Roberts M.D.   On: 10/15/2021 18:27    Assessment/Plan: [id number] [id number] with R ovarian cyst, torsed R ovary D/w pt r/b/a of operative laparoscopy, possible right salpingoopherectomy, possible right ovarian cystectomy D/w pt  postoperative expectations/limitations Surgery posted expect to be performed at 10:30-11pm  Lane Eland Bovard-Stuckert 10/15/2021, 7:32 PM

## 2021-10-15 NOTE — Anesthesia Preprocedure Evaluation (Signed)
Anesthesia Evaluation  Patient identified by MRN, date of birth, ID band Patient awake    Reviewed: Allergy & Precautions, NPO status , Patient's Chart, lab work & pertinent test results  Airway Mallampati: I  TM Distance: >3 FB Neck ROM: Full    Dental no notable dental hx.    Pulmonary neg pulmonary ROS,    Pulmonary exam normal        Cardiovascular negative cardio ROS   Rhythm:Regular Rate:Normal     Neuro/Psych  Headaches, Seizures -, Well Controlled,  negative psych ROS   GI/Hepatic negative GI ROS, Neg liver ROS,   Endo/Other  negative endocrine ROS  Renal/GU negative Renal ROS  Female GU complaint Ovarian torsion    Musculoskeletal   Abdominal Normal abdominal exam  (+)   Peds  Hematology  (+) Blood dyscrasia, anemia ,   Anesthesia Other Findings   Reproductive/Obstetrics                             Anesthesia Physical Anesthesia Plan  ASA: 2  Anesthesia Plan: General   Post-op Pain Management:    Induction: Intravenous  PONV Risk Score and Plan: 3 and Ondansetron, Dexamethasone, Midazolam and Treatment may vary due to age or medical condition  Airway Management Planned: Mask and Oral ETT  Additional Equipment: None  Intra-op Plan:   Post-operative Plan: Extubation in OR  Informed Consent: I have reviewed the patients History and Physical, chart, labs and discussed the procedure including the risks, benefits and alternatives for the proposed anesthesia with the patient or authorized representative who has indicated his/her understanding and acceptance.     Dental advisory given  Plan Discussed with: CRNA  Anesthesia Plan Comments: (Lab Results      Component                Value               Date                      WBC                      10.3                10/15/2021                HGB                      11.9 (L)            10/15/2021                 HCT                      35.0 (L)            10/15/2021                MCV                      86.8                10/15/2021                PLT                      204  10/15/2021           Lab Results      Component                Value               Date                      NA                       142                 10/15/2021                K                        4.0                 10/15/2021                CO2                      21 (L)              10/15/2021                GLUCOSE                  108 (H)             10/15/2021                BUN                      9                   10/15/2021                CREATININE               0.60                10/15/2021                CALCIUM                  8.0 (L)             10/15/2021                EGFR                     125                 09/15/2021                GFRNONAA                 >60                 10/15/2021          )        Anesthesia Quick Evaluation

## 2021-10-15 NOTE — ED Notes (Signed)
Patient helped to the bed pan at this time

## 2021-10-16 ENCOUNTER — Encounter (HOSPITAL_COMMUNITY): Payer: Self-pay | Admitting: Obstetrics and Gynecology

## 2021-10-16 ENCOUNTER — Other Ambulatory Visit: Payer: Self-pay

## 2021-10-16 MED ORDER — IBUPROFEN 800 MG PO TABS: 800.0000 mg | ORAL_TABLET | Freq: Three times a day (TID) | ORAL | 1 refills | Status: DC | PRN

## 2021-10-16 MED ORDER — ONDANSETRON HCL 4 MG/2ML IJ SOLN
INTRAMUSCULAR | Status: AC
  Filled 2021-10-16: qty 2

## 2021-10-16 MED ORDER — AMISULPRIDE (ANTIEMETIC) 5 MG/2ML IV SOLN
10.0000 mg | Freq: Once | INTRAVENOUS | Status: AC
Start: 2021-10-16 — End: 2021-10-16
  Administered 2021-10-16: 10 mg via INTRAVENOUS

## 2021-10-16 MED ORDER — BUPIVACAINE HCL (PF) 0.25 % IJ SOLN
INTRAMUSCULAR | Status: DC | PRN
  Administered 2021-10-16: 25 mL

## 2021-10-16 MED ORDER — ONDANSETRON HCL 4 MG/2ML IJ SOLN
4.0000 mg | Freq: Once | INTRAMUSCULAR | Status: AC
  Administered 2021-10-16: 4 mg via INTRAVENOUS

## 2021-10-16 MED ORDER — OXYCODONE HCL 5 MG PO TABS
5.0000 mg | ORAL_TABLET | Freq: Once | ORAL | Status: AC
  Administered 2021-10-16: 5 mg via ORAL

## 2021-10-16 MED ORDER — FENTANYL CITRATE (PF) 100 MCG/2ML IJ SOLN
INTRAMUSCULAR | Status: AC
  Filled 2021-10-16: qty 2

## 2021-10-16 MED ORDER — OXYCODONE HCL 5 MG PO TABS: ORAL_TABLET | ORAL | 0 refills | Status: DC

## 2021-10-16 MED ORDER — OXYCODONE HCL 5 MG PO TABS
ORAL_TABLET | ORAL | Status: AC
  Filled 2021-10-16: qty 1

## 2021-10-16 MED ORDER — ACETAMINOPHEN 10 MG/ML IV SOLN
1000.0000 mg | Freq: Once | INTRAVENOUS | Status: DC | PRN
  Administered 2021-10-16: 1000 mg via INTRAVENOUS

## 2021-10-16 MED ORDER — AMISULPRIDE (ANTIEMETIC) 5 MG/2ML IV SOLN
INTRAVENOUS | Status: AC
  Filled 2021-10-16: qty 4

## 2021-10-16 MED ORDER — FENTANYL CITRATE (PF) 100 MCG/2ML IJ SOLN
25.0000 ug | INTRAMUSCULAR | Status: DC | PRN
  Administered 2021-10-16: 25 ug via INTRAVENOUS

## 2021-10-16 MED ORDER — SODIUM CHLORIDE 0.9 % IR SOLN
Status: DC | PRN
  Administered 2021-10-16: 3000 mL

## 2021-10-16 MED ORDER — ACETAMINOPHEN 10 MG/ML IV SOLN
INTRAVENOUS | Status: AC
  Filled 2021-10-16: qty 100

## 2021-10-16 NOTE — Anesthesia Postprocedure Evaluation (Signed)
Anesthesia Post Note  Patient: Brandy Sanders  Procedure(s) Performed: LAPAROSCOPIC RIGHT SALPINGO OOPHORECTOMY (Right)     Patient location during evaluation: PACU Anesthesia Type: General Level of consciousness: awake and alert Pain management: pain level controlled Vital Signs Assessment: post-procedure vital signs reviewed and stable Respiratory status: spontaneous breathing, nonlabored ventilation, respiratory function stable and patient connected to nasal cannula oxygen Cardiovascular status: blood pressure returned to baseline and stable Postop Assessment: no apparent nausea or vomiting Anesthetic complications: no   No notable events documented.  Last Vitals:  Vitals:   10/16/21 0245 10/16/21 0300  BP: 101/68   Pulse: (!) 124 (!) 105  Resp: 12 13  Temp:  36.7 C  SpO2: 98% 97%    Last Pain:  Vitals:   10/16/21 0200  TempSrc:   PainSc: 10-Worst pain ever                 Earl Lites P Lajarvis Italiano

## 2021-10-16 NOTE — Brief Op Note (Signed)
10/15/2021 - 10/16/2021  1:50 AM  PATIENT:  Brandy Sanders  26 y.o. female  PRE-OPERATIVE DIAGNOSIS:  TORSION, RIGHT OVARY  POST-OPERATIVE DIAGNOSIS:  RIGHT OVARIAN CYST, RIGHT OVARIAN TORSION  PROCEDURE:  Procedure(s): LAPAROSCOPIC RIGHT SALPINGO OOPHORECTOMY (Right)  SURGEON:  Surgeon(s) and Role:    * Bovard-Stuckert, Jadon Ressler, MD - Primary  ANESTHESIA:   local and general  EBL:  200 mL IVF and uop per anesthesia  DRAINS: none   LOCAL MEDICATIONS USED:  MARCAINE     SPECIMEN:  Source of Specimen:  R fallopian tube and R ovary  DISPOSITION OF SPECIMEN:  PATHOLOGY  COUNTS:  YES  DICTATION: .Other Dictation: Dictation Number [id number]  PLAN OF CARE: Discharge to home after PACU  PATIENT DISPOSITION:  PACU - hemodynamically stable.   Delay start of Pharmacological VTE agent (>24hrs) due to surgical blood loss or risk of bleeding: not applicable

## 2021-10-16 NOTE — Transfer of Care (Signed)
Immediate Anesthesia Transfer of Care Note  Patient: Brandy Sanders  Procedure(s) Performed: LAPAROSCOPIC RIGHT SALPINGO OOPHORECTOMY (Right)  Patient Location: PACU  Anesthesia Type:General  Level of Consciousness: drowsy and patient cooperative  Airway & Oxygen Therapy: Patient Spontanous Breathing and Patient connected to nasal cannula oxygen  Post-op Assessment: Report given to RN and Post -op Vital signs reviewed and stable  Post vital signs: Reviewed and stable  Last Vitals:  Vitals Value Taken Time  BP    Temp    Pulse 122 10/16/21 0202  Resp 17 10/16/21 0202  SpO2 99 % 10/16/21 0202  Vitals shown include unvalidated device data.  Last Pain:  Vitals:   10/15/21 2019  TempSrc:   PainSc: 10-Worst pain ever         Complications: No notable events documented.

## 2021-10-16 NOTE — Op Note (Signed)
NAME: Brandy Sanders, CAPEK MEDICAL RECORD NO: [id number] ACCOUNT NO: [id number] DATE OF BIRTH: 08/23/1995 FACILITY: [facility] LOCATION: [facility] PHYSICIAN: Sherian Rein, MD  Operative Report   DATE OF PROCEDURE: 10/15/2021   PREOPERATIVE DIAGNOSIS:  Right ovarian torsion.   POSTOPERATIVE DIAGNOSIS:   Right ovarian cyst with right ovarian torsion.  PROCEDURE:  Operative laparoscopy, laparoscopic right salpingo-oophorectomy.  SURGEON: Sherian Rein, MD  ASSISTANT:  None.  ANESTHESIA:  Marcaine 0.25% and general anesthesia.  ESTIMATED BLOOD LOSS:  200 mL.  IV FLUIDS AND URINE OUTPUT:  Per anesthesia.  COMPLICATIONS:  The necrosed ovary and tube were large, necessitating an enlargement of the accessory port on the left tube removed through a 15 mm port.  PATHOLOGY:  Right fallopian tube and right ovary to pathology.  DESCRIPTION OF PROCEDURE:  After informed consent was reviewed with the patient and her brother and questions were answered.  We had to wait for the operating room to be ready.  She remained stable throughout wait - pain controlled with intermittent dilaudid.  When the OR was ready, she was transported to the operating room and placed on the table in supine position.  General anesthesia was  induced and found to be adequate.  After an appropriate timeout was performed, she was then placed in the Yellofin stirrups, prepped and draped in the typical fashion.  Using an open-sided speculum, her cervix was visualized and a Hulka manipulator was placed.  Gloves were changed.  Attention was  turned to the abdominal portion of the case.  An approximately 5 mm infraumbilical incision was made and with direct entry, a 5 mm trocar was placed. Brief pelvic survey was performed.  The opening pressure was 2 mmH g.  The survey revealed an obvious right ovarian torsion and an  approximately 10 x 10 cm necrotic tubo-ovarian complex.  The fallopian tube was grasped at the  fimbriated end and excised with Harmonic scalpel.  The ovary was then also excised with Harmonic scalpel.  The ovary and tube were attempted to be placed in a  10 mm EndoCatch bag.  This was unable to be done, so the port was switched out to a 15 mm port using a 15 mm bag.  The tube and ovary were removed from the bag.  They had to be macerated to be removed.  The bag was brought to the edge of the skin. A brief pelvic survey was also performed revealing a normal gallbladder, normal appendix.  The excision site was hemostatic after application of Arista.  Under direct visualization the 15 mm port was closed with 0 Vicryl.  This was inspected after closed  laparoscopically, tissues were seen assuring that the fascia was reapproximated, deep suture of 2-0 Vicryl was used as well as suture of 4-0 Vicryl subcuticular on all 3 ports.  Dermabond was also applied.  The patient tolerated the procedure well.  The  pneumoperitoneum was evacuated prior to closing the incisions.  The patient tolerated the procedure well.  Sponge, lap, and needle count was correct x2 at the end of the procedure.     [email or web address] D: 10/16/2021 1:59:33 am T: 10/16/2021 4:49:00 am  JOB: [id number]/ [id number]

## 2021-10-18 LAB — SURGICAL PATHOLOGY

## 2021-11-17 ENCOUNTER — Other Ambulatory Visit: Payer: Medicaid Other

## 2021-11-20 ENCOUNTER — Ambulatory Visit: Payer: Self-pay

## 2021-11-20 NOTE — Telephone Encounter (Signed)
  Chief Complaint: Sore throat Symptoms: Pain when swallowing Frequency: 2 days Pertinent Negatives: Patient denies fever Disposition: [] ED /[x] Urgent Care (no appt availability in office) / [] Appointment(In office/virtual)/ []  [facility]/ [] Home Care/ [] Refused Recommended Disposition /[] [facility]/ []  Follow-up with PCP Additional Notes: PT was sick about 2 weeks ago. Now pt has a mild sore throat. Pt has no other s/s. PT is going out of town and wishes to be seen prior to leaving.  Summary: Sore throat Advice   Pt is calling to report a sore throat for 2 days. No available appts until Friday. Pt reports that she is going out of town and was hoping to have something sooner. Please advise      Reason for Disposition  [1] Sore throat is the only symptom AND [2] present > 48 hours  Answer Assessment - Initial Assessment Questions 1. ONSET: "When did the throat start hurting?" (Hours or days ago)      2 days 2. SEVERITY: "How bad is the sore throat?" (Scale 1-10; mild, moderate or severe)   - MILD (1-3):  Doesn't interfere with eating or normal activities.   - MODERATE (4-7): Interferes with eating some solids and normal activities.   - SEVERE (8-10):  Excruciating pain, interferes with most normal activities.   - SEVERE WITH DYSPHAGIA (10): Can't swallow liquids, drooling.     2-3/10 3. STREP EXPOSURE: "Has there been any exposure to strep within the past week?" If Yes, ask: "What type of contact occurred?"      no 4.  VIRAL SYMPTOMS: "Are there any symptoms of a cold, such as a runny nose, cough, hoarse voice or red eyes?"      No - was sick about 2 weeks ago. Congestion HA, chills 5. FEVER: "Do you have a fever?" If Yes, ask: "What is your temperature, how was it measured, and when did it start?"     No 6. PUS ON THE TONSILS: "Is there pus on the tonsils in the back of your throat?"     no 7. OTHER SYMPTOMS: "Do you have any other symptoms?" (e.g., difficulty  breathing, headache, rash)     HA 8. PREGNANCY: "Is there any chance you are pregnant?" "When was your last menstrual period?"     no  Protocols used: Sore Throat-A-AH

## 2021-11-22 ENCOUNTER — Other Ambulatory Visit: Payer: Medicaid Other

## 2021-12-15 ENCOUNTER — Other Ambulatory Visit: Payer: Self-pay | Admitting: Family

## 2021-12-15 DIAGNOSIS — E559 Vitamin D deficiency, unspecified: Secondary | ICD-10-CM

## 2022-01-14 ENCOUNTER — Encounter: Payer: Self-pay | Admitting: Emergency Medicine

## 2022-01-14 ENCOUNTER — Ambulatory Visit
Admission: EM | Admit: 2022-01-14 | Discharge: 2022-01-14 | Disposition: A | Discharge status: Home / Self Care | Payer: Medicaid Other | Attending: Urgent Care | Admitting: Urgent Care

## 2022-01-14 DIAGNOSIS — B349 Viral infection, unspecified: Secondary | ICD-10-CM

## 2022-01-14 MED ORDER — PSEUDOEPHEDRINE HCL 60 MG PO TABS: 60.0000 mg | ORAL_TABLET | Freq: Three times a day (TID) | ORAL | 0 refills | Status: DC | PRN

## 2022-01-14 MED ORDER — CETIRIZINE HCL 10 MG PO TABS: 10.0000 mg | ORAL_TABLET | Freq: Every day | ORAL | 0 refills | Status: DC

## 2022-01-14 MED ORDER — PROMETHAZINE-DM 6.25-15 MG/5ML PO SYRP: 2.5000 mL | ORAL_SOLUTION | Freq: Three times a day (TID) | ORAL | 0 refills | Status: DC | PRN

## 2022-01-14 MED ORDER — AMOXICILLIN 875 MG PO TABS: 875.0000 mg | ORAL_TABLET | Freq: Two times a day (BID) | ORAL | 0 refills | Status: DC

## 2022-01-14 MED ORDER — BENZONATATE 100 MG PO CAPS: 100.0000 mg | ORAL_CAPSULE | Freq: Three times a day (TID) | ORAL | 0 refills | Status: DC | PRN

## 2022-01-14 NOTE — ED Triage Notes (Signed)
Friday began having cough, sore throat, nasal congestion. Denies fever, generalized body aches. Symptoms started Friday. Taking nyquil and motrin, last doses last night.

## 2022-01-14 NOTE — Discharge Instructions (Signed)
We will manage this as a viral syndrome, viral respiratory illness. For sore throat or cough try using a honey-based tea. Use 3 teaspoons of honey with juice squeezed from half lemon. Place shaved pieces of ginger into 1/2-1 cup of water and warm over stove top. Then mix the ingredients and repeat every 4 hours as needed. Please take Tylenol 500mg -650mg  every 6 hours for aches and pains, fevers. Hydrate very well with at least 2 liters of water. Eat light meals such as soups to replenish electrolytes and soft fruits, veggies. Start an antihistamine like Zyrtec for postnasal drainage, sinus congestion.  You can take this together with pseudoephedrine (Sudafed) at a dose of 60 mg 2-3 times a day as needed for the same kind of congestion.  Use the cough medications as needed.

## 2022-01-14 NOTE — ED Provider Notes (Signed)
[facility]  Note:  This document was prepared using Conservation officer, historic buildings and may include unintentional dictation errors.  MRN: [id number] DOB: 01/02/1996  Subjective:   Brandy Sanders is a 26 y.o. female presenting for 3 day history of acute onset coughing, sinus congestion, sinus drainage, throat pain.  Symptoms can keep her up at night.  She has had 2 sick contacts with her 2 children.  One of them was prescribed an antibiotic course given the timeline of his illness.  Patient has been using NyQuil and Motrin.  Does not want a COVID test.  No chest pain, shortness of breath, wheezing, fevers, body aches.  No smoking.  No history of respiratory disorders.  Has a remote history of 2 separate seizures.  Does not have a seizure disorder.  No chronic medications.  No Known Allergies  Past Medical History:  Diagnosis Date   Anemia    Takes Iron supplement   Cholestasis of pregnancy in third trimester    Headache    relief with Tylenol   Ovarian torsion 10/15/2021   Seizures (HCC) 2008   Had seizure in 3rd month of first pregnancy in 2013   SVD (spontaneous vaginal delivery) 06/02/2018     Past Surgical History:  Procedure Laterality Date   LAPAROSCOPIC SALPINGO OOPHERECTOMY Right 10/15/2021   Procedure: LAPAROSCOPIC RIGHT SALPINGO OOPHORECTOMY;  Surgeon: Sherian Rein, MD;  Location: [facility];  Service: Gynecology;  Laterality: Right;   WISDOM TOOTH EXTRACTION  2014    Family History  Problem Relation Age of Onset   Hypertension Mother    Diabetes Maternal Grandmother     Social History   Tobacco Use   Smoking status: Never   Smokeless tobacco: Never  Vaping Use   Vaping Use: Never used  Substance Use Topics   Alcohol use: Yes    Comment: occ   Drug use: No    ROS   Objective:   Vitals: BP 106/74 (BP Location: Right Arm)   Pulse 99   Temp 98.2 F (36.8 C) (Oral)   Resp 16   SpO2 100%   Physical  Exam Constitutional:      General: She is not in acute distress.    Appearance: Normal appearance. She is well-developed and normal weight. She is not ill-appearing, toxic-appearing or diaphoretic.  HENT:     Head: Normocephalic and atraumatic.     Right Ear: Tympanic membrane, ear canal and external ear normal. No drainage or tenderness. No middle ear effusion. There is no impacted cerumen. Tympanic membrane is not erythematous.     Left Ear: Tympanic membrane, ear canal and external ear normal. No drainage or tenderness.  No middle ear effusion. There is no impacted cerumen. Tympanic membrane is not erythematous.     Nose: Congestion present. No rhinorrhea.     Mouth/Throat:     Mouth: Mucous membranes are moist. No oral lesions.     Pharynx: No pharyngeal swelling, oropharyngeal exudate, posterior oropharyngeal erythema or uvula swelling.     Tonsils: No tonsillar exudate or tonsillar abscesses.  Eyes:     General: No scleral icterus.       Right eye: No discharge.        Left eye: No discharge.     Extraocular Movements: Extraocular movements intact.     Right eye: Normal extraocular motion.     Left eye: Normal extraocular motion.     Conjunctiva/sclera: Conjunctivae normal.  Cardiovascular:     Rate  and Rhythm: Normal rate and regular rhythm.     Heart sounds: Normal heart sounds. No murmur heard.    No friction rub. No gallop.  Pulmonary:     Effort: Pulmonary effort is normal. No respiratory distress.     Breath sounds: No stridor. No wheezing, rhonchi or rales.  Chest:     Chest wall: No tenderness.  Musculoskeletal:     Cervical back: Normal range of motion and neck supple.  Lymphadenopathy:     Cervical: No cervical adenopathy.  Skin:    General: Skin is warm and dry.  Neurological:     General: No focal deficit present.     Mental Status: She is alert and oriented to person, place, and time.  Psychiatric:        Mood and Affect: Mood normal.        Behavior:  Behavior normal.        Thought Content: Thought content normal.        Judgment: Judgment normal.     Assessment and Plan :   PDMP not reviewed this encounter.  1. Acute viral syndrome     Suspect viral URI, viral syndrome. Physical exam findings reassuring and vital signs stable for discharge. Advised supportive care, offered symptomatic relief.  Patient was very reasonable and agreeable despite her children having gotten antibiotics.  I recommended that she wait 1 more week to see if her symptoms resolve and if not she can go ahead and fill a prescription for amoxicillin to address a similar sinusitis as her children.  She did deferred COVID testing.  Counseled patient on potential for adverse effects with medications prescribed/recommended today, ER and return-to-clinic precautions discussed, patient verbalized understanding.     Jaynee Eagles, PA-C 01/14/22 1113

## 2022-03-04 ENCOUNTER — Telehealth: Payer: Self-pay | Admitting: Emergency Medicine

## 2022-03-04 MED ORDER — OSELTAMIVIR PHOSPHATE 75 MG PO CAPS: 75.0000 mg | ORAL_CAPSULE | Freq: Two times a day (BID) | ORAL | 0 refills | Status: AC

## 2022-03-04 NOTE — Telephone Encounter (Signed)
Patient son tested positive for influenza on March 03, 2022.  Patient provided with prescription for Tamiflu.

## 2022-03-15 ENCOUNTER — Ambulatory Visit
Admission: EM | Admit: 2022-03-15 | Discharge: 2022-03-15 | Disposition: A | Discharge status: Home / Self Care | Payer: Medicaid Other | Attending: Urgent Care | Admitting: Urgent Care

## 2022-03-15 DIAGNOSIS — Z1152 Encounter for screening for COVID-19: Secondary | ICD-10-CM | POA: Insufficient documentation

## 2022-03-15 DIAGNOSIS — R509 Fever, unspecified: Secondary | ICD-10-CM | POA: Insufficient documentation

## 2022-03-15 DIAGNOSIS — R070 Pain in throat: Secondary | ICD-10-CM | POA: Diagnosis not present

## 2022-03-15 DIAGNOSIS — J101 Influenza due to other identified influenza virus with other respiratory manifestations: Secondary | ICD-10-CM | POA: Diagnosis not present

## 2022-03-15 DIAGNOSIS — J018 Other acute sinusitis: Secondary | ICD-10-CM | POA: Diagnosis not present

## 2022-03-15 LAB — RESP PANEL BY RT-PCR (FLU A&B, COVID) ARPGX2
Influenza A by PCR: POSITIVE — AB
Influenza B by PCR: NEGATIVE
SARS Coronavirus 2 by RT PCR: NEGATIVE

## 2022-03-15 LAB — POCT RAPID STREP A (OFFICE): Rapid Strep A Screen: NEGATIVE

## 2022-03-15 MED ORDER — AMOXICILLIN 875 MG PO TABS: 875.0000 mg | ORAL_TABLET | Freq: Two times a day (BID) | ORAL | 0 refills | Status: DC

## 2022-03-15 NOTE — ED Triage Notes (Signed)
Pt c/o fever, body aches, nasal congestion, sore throat-started yesterday-NAD-steady gait

## 2022-03-15 NOTE — ED Provider Notes (Signed)
[facility]  Note:  This document was prepared using Systems analyst and may include unintentional dictation errors.  MRN: [id number] DOB: 04-30-1995  Subjective:   Brandy Sanders is a 26 y.o. female presenting for 1 day history of acute onset recurrent fever, body pains, sinus congestion, sinus pain, throat pain.  Has minimal cough.  Patient has actually been sick on and off for the past 2 months.  The most recent episode happened within the past 2 weeks.  Patient was seen and treated empirically for influenza with Tamiflu and has 2 of her children had this.  She completed that treatment last week on Friday.  She ended up having a couple of days of improvement before she turned around and had her current symptoms.  No chest pain, shortness of breath or wheezing.  Has been using over-the-counter medications with minimal relief.  Previously in October she was provided with a prescription for amoxicillin but she did not end up using it as she recovered from that particular viral illness.  No current facility-administered medications for this encounter.  Current Outpatient Medications:    acetaminophen (TYLENOL) 325 MG tablet, Take 2 tablets (650 mg total) by mouth every 4 (four) hours as needed (for pain scale < 4). (Patient not taking: Reported on 09/15/2021), Disp: 30 tablet, Rfl: 0   amoxicillin (AMOXIL) 875 MG tablet, Take 1 tablet (875 mg total) by mouth 2 (two) times daily., Disp: 14 tablet, Rfl: 0   benzonatate (TESSALON) 100 MG capsule, Take 1 capsule (100 mg total) by mouth 3 (three) times daily as needed for cough., Disp: 30 capsule, Rfl: 0   cetirizine (ZYRTEC ALLERGY) 10 MG tablet, Take 1 tablet (10 mg total) by mouth daily., Disp: 30 tablet, Rfl: 0   ibuprofen (ADVIL) 800 MG tablet, Take 1 tablet (800 mg total) by mouth every 8 (eight) hours as needed for moderate pain., Disp: 30 tablet, Rfl: 1   Multiple Vitamin (MULTI-VITAMIN DAILY PO),  Multi Vitamin (Patient not taking: Reported on 10/15/2021), Disp: , Rfl:    oxyCODONE (ROXICODONE) 5 MG immediate release tablet, 1-2 tablets po q 6 hours prn severe pain, Disp: 20 tablet, Rfl: 0   promethazine-dextromethorphan (PROMETHAZINE-DM) 6.25-15 MG/5ML syrup, Take 2.5 mLs by mouth 3 (three) times daily as needed for cough., Disp: 100 mL, Rfl: 0   pseudoephedrine (SUDAFED) 60 MG tablet, Take 1 tablet (60 mg total) by mouth every 8 (eight) hours as needed for congestion., Disp: 30 tablet, Rfl: 0   Vitamin D, Ergocalciferol, (DRISDOL) 1.25 MG (50000 UNIT) CAPS capsule, TAKE 1 CAPSULE BY MOUTH EVERY 7 DAYS FOR 12 DOSES, Disp: 4 capsule, Rfl: 2   No Known Allergies  Past Medical History:  Diagnosis Date   Anemia    Takes Iron supplement   Cholestasis of pregnancy in third trimester    Headache    relief with Tylenol   Ovarian torsion 10/15/2021   Seizures ([place]) 2008   Had seizure in 3rd month of first pregnancy in 2013   SVD (spontaneous vaginal delivery) 06/02/2018     Past Surgical History:  Procedure Laterality Date   LAPAROSCOPIC SALPINGO OOPHERECTOMY Right 10/15/2021   Procedure: LAPAROSCOPIC RIGHT SALPINGO OOPHORECTOMY;  Surgeon: Janyth Contes, MD;  Location: [facility];  Service: Gynecology;  Laterality: Right;   WISDOM TOOTH EXTRACTION  2014    Family History  Problem Relation Age of Onset   Hypertension Mother    Diabetes Maternal Grandmother     Social History  Tobacco Use   Smoking status: Never   Smokeless tobacco: Never  Vaping Use   Vaping Use: Never used  Substance Use Topics   Alcohol use: Yes    Comment: occ   Drug use: No    ROS   Objective:   Vitals: BP 101/72 (BP Location: Right Arm)   Pulse (!) 115   Temp 98.7 F (37.1 C) (Oral)   Resp 20   LMP 02/22/2022 (Approximate)   SpO2 98%   Physical Exam Constitutional:      General: She is not in acute distress.    Appearance: Normal appearance. She is well-developed and normal weight. She  is not ill-appearing, toxic-appearing or diaphoretic.  HENT:     Head: Normocephalic and atraumatic.     Right Ear: Tympanic membrane, ear canal and external ear normal. No drainage or tenderness. No middle ear effusion. There is no impacted cerumen. Tympanic membrane is not erythematous or bulging.     Left Ear: Tympanic membrane, ear canal and external ear normal. No drainage or tenderness.  No middle ear effusion. There is no impacted cerumen. Tympanic membrane is not erythematous or bulging.     Nose: Congestion and rhinorrhea present.     Mouth/Throat:     Mouth: Mucous membranes are moist. No oral lesions.     Pharynx: Posterior oropharyngeal erythema (with associated post-nasal drainage) present. No pharyngeal swelling, oropharyngeal exudate or uvula swelling.     Tonsils: No tonsillar exudate or tonsillar abscesses.  Eyes:     General: No scleral icterus.       Right eye: No discharge.        Left eye: No discharge.     Extraocular Movements: Extraocular movements intact.     Right eye: Normal extraocular motion.     Left eye: Normal extraocular motion.     Conjunctiva/sclera: Conjunctivae normal.  Cardiovascular:     Rate and Rhythm: Normal rate and regular rhythm.     Heart sounds: Normal heart sounds. No murmur heard.    No friction rub. No gallop.  Pulmonary:     Effort: Pulmonary effort is normal. No respiratory distress.     Breath sounds: No stridor. No wheezing, rhonchi or rales.  Chest:     Chest wall: No tenderness.  Musculoskeletal:     Cervical back: Normal range of motion and neck supple.  Lymphadenopathy:     Cervical: No cervical adenopathy.  Skin:    General: Skin is warm and dry.  Neurological:     General: No focal deficit present.     Mental Status: She is alert and oriented to person, place, and time.  Psychiatric:        Mood and Affect: Mood normal.        Behavior: Behavior normal.     Results for orders placed or performed during the hospital  encounter of 03/15/22 (from the past 24 hour(s))  POCT rapid strep A     Status: None   Collection Time: 03/15/22 12:16 PM  Result Value Ref Range   Rapid Strep A Screen Negative Negative    Assessment and Plan :   PDMP not reviewed this encounter.  1. Other acute sinusitis, recurrence not specified   2. Fever, unspecified   3. Throat pain     Strep culture pending, COVID and flu test pending.  Start amoxicillin to address sinusitis.  Recommend supportive care otherwise. Deferred imaging given clear cardiopulmonary exam, hemodynamically stable vital signs. Counseled patient on  potential for adverse effects with medications prescribed/recommended today, ER and return-to-clinic precautions discussed, patient verbalized understanding.    Jaynee Eagles, PA-C 03/15/22 1230

## 2022-03-15 NOTE — Discharge Instructions (Addendum)
We will manage this as a secondary sinus infection with amoxicillin. Take this antibiotic even if your test results are positive for COVID or flu. For sore throat or cough try using a honey-based tea. Use 3 teaspoons of honey with juice squeezed from half lemon. Place shaved pieces of ginger into 1/2-1 cup of water and warm over stove top. Then mix the ingredients and repeat every 4 hours as needed. Please take ibuprofen 600mg  every 6 hours with food alternating with OR taken together with Tylenol 500mg -650mg  every 6 hours for throat pain, fevers, aches and pains. Hydrate very well with at least 2 liters of water. Eat light meals such as soups (chicken and noodles, vegetable, chicken and wild rice).  Do not eat foods that you are allergic to.  Taking an antihistamine like Zyrtec can help against postnasal drainage, sinus congestion which can cause sinus pain, sinus headaches, throat pain, painful swallowing, coughing.

## 2022-03-18 LAB — CULTURE, GROUP A STREP (THRC)

## 2022-04-22 ENCOUNTER — Ambulatory Visit
Admission: RE | Admit: 2022-04-22 | Discharge: 2022-04-22 | Disposition: A | Discharge status: Home / Self Care | Payer: Medicaid Other | Source: Ambulatory Visit | Attending: Urgent Care | Admitting: Urgent Care

## 2022-04-22 VITALS — BP 117/76 | HR 98 | Temp 98.8°F | Resp 16

## 2022-04-22 DIAGNOSIS — J111 Influenza due to unidentified influenza virus with other respiratory manifestations: Secondary | ICD-10-CM

## 2022-04-22 MED ORDER — OSELTAMIVIR PHOSPHATE 75 MG PO CAPS: 75.0000 mg | ORAL_CAPSULE | Freq: Two times a day (BID) | ORAL | 0 refills | Status: DC

## 2022-04-22 NOTE — ED Triage Notes (Signed)
Pt c/o HA, body aches, nasal congestion started last night-reports +flu exposure in the home-NAD-steady gait

## 2022-04-22 NOTE — ED Provider Notes (Signed)
[facility]  Note:  This document was prepared using Conservation officer, historic buildings and may include unintentional dictation errors.  MRN: [id number] DOB: 11/15/1995  Subjective:   Brandy Sanders is a 27 y.o. female presenting for 1 day history of acute onset body aches, headaches, malaise and fatigue, congestion.  Had exposure to influenza and being with her son.  No chest pain, shob, wheezing. No asthma. Patient was last seen and treated for sinusitis with supportive care, amoxicillin (printed script) from a visit on 03/15/2022.  She did not actually end up taking it and recovered on her own.  She did test positive for influenza A that same visit but did not undergo Tamiflu.  No current facility-administered medications for this encounter.  Current Outpatient Medications:    acetaminophen (TYLENOL) 325 MG tablet, Take 2 tablets (650 mg total) by mouth every 4 (four) hours as needed (for pain scale < 4). (Patient not taking: Reported on 09/15/2021), Disp: 30 tablet, Rfl: 0   amoxicillin (AMOXIL) 875 MG tablet, Take 1 tablet (875 mg total) by mouth 2 (two) times daily., Disp: 14 tablet, Rfl: 0   benzonatate (TESSALON) 100 MG capsule, Take 1 capsule (100 mg total) by mouth 3 (three) times daily as needed for cough., Disp: 30 capsule, Rfl: 0   cetirizine (ZYRTEC ALLERGY) 10 MG tablet, Take 1 tablet (10 mg total) by mouth daily., Disp: 30 tablet, Rfl: 0   ibuprofen (ADVIL) 800 MG tablet, Take 1 tablet (800 mg total) by mouth every 8 (eight) hours as needed for moderate pain., Disp: 30 tablet, Rfl: 1   Multiple Vitamin (MULTI-VITAMIN DAILY PO), Multi Vitamin (Patient not taking: Reported on 10/15/2021), Disp: , Rfl:    oxyCODONE (ROXICODONE) 5 MG immediate release tablet, 1-2 tablets po q 6 hours prn severe pain, Disp: 20 tablet, Rfl: 0   promethazine-dextromethorphan (PROMETHAZINE-DM) 6.25-15 MG/5ML syrup, Take 2.5 mLs by mouth 3 (three) times daily as needed for  cough., Disp: 100 mL, Rfl: 0   pseudoephedrine (SUDAFED) 60 MG tablet, Take 1 tablet (60 mg total) by mouth every 8 (eight) hours as needed for congestion., Disp: 30 tablet, Rfl: 0   Vitamin D, Ergocalciferol, (DRISDOL) 1.25 MG (50000 UNIT) CAPS capsule, TAKE 1 CAPSULE BY MOUTH EVERY 7 DAYS FOR 12 DOSES, Disp: 4 capsule, Rfl: 2   No Known Allergies  Past Medical History:  Diagnosis Date   Anemia    Takes Iron supplement   Cholestasis of pregnancy in third trimester    Headache    relief with Tylenol   Ovarian torsion 10/15/2021   Seizures ([facility]CC) 2008   Had seizure in 3rd month of first pregnancy in 2013   SVD (spontaneous vaginal delivery) 06/02/2018     Past Surgical History:  Procedure Laterality Date   LAPAROSCOPIC SALPINGO OOPHERECTOMY Right 10/15/2021   Procedure: LAPAROSCOPIC RIGHT SALPINGO OOPHORECTOMY;  Surgeon: Sherian Rein, MD;  Location: [facility];  Service: Gynecology;  Laterality: Right;   WISDOM TOOTH EXTRACTION  2014    Family History  Problem Relation Age of Onset   Hypertension Mother    Diabetes Maternal Grandmother     Social History   Tobacco Use   Smoking status: Never   Smokeless tobacco: Never  Vaping Use   Vaping Use: Never used  Substance Use Topics   Alcohol use: Yes    Comment: occ   Drug use: No    ROS   Objective:   Vitals: BP 117/76 (BP Location: Right Arm)  Pulse 98   Temp 98.8 F (37.1 C) (Oral)   Resp 16   LMP 03/31/2022   SpO2 97%   Physical Exam Constitutional:      General: She is not in acute distress.    Appearance: Normal appearance. She is well-developed and normal weight. She is not ill-appearing, toxic-appearing or diaphoretic.  HENT:     Head: Normocephalic and atraumatic.     Right Ear: Tympanic membrane, ear canal and external ear normal. No drainage or tenderness. No middle ear effusion. There is no impacted cerumen. Tympanic membrane is not erythematous or bulging.     Left Ear: Tympanic membrane, ear  canal and external ear normal. No drainage or tenderness.  No middle ear effusion. There is no impacted cerumen. Tympanic membrane is not erythematous or bulging.     Nose: Nose normal. No congestion or rhinorrhea.     Mouth/Throat:     Mouth: Mucous membranes are moist. No oral lesions.     Pharynx: No pharyngeal swelling, oropharyngeal exudate, posterior oropharyngeal erythema or uvula swelling.     Tonsils: No tonsillar exudate or tonsillar abscesses.  Eyes:     General: No scleral icterus.       Right eye: No discharge.        Left eye: No discharge.     Extraocular Movements: Extraocular movements intact.     Right eye: Normal extraocular motion.     Left eye: Normal extraocular motion.     Conjunctiva/sclera: Conjunctivae normal.  Cardiovascular:     Rate and Rhythm: Normal rate and regular rhythm.     Heart sounds: Normal heart sounds. No murmur heard.    No friction rub. No gallop.  Pulmonary:     Effort: Pulmonary effort is normal. No respiratory distress.     Breath sounds: No stridor. No wheezing, rhonchi or rales.  Chest:     Chest wall: No tenderness.  Musculoskeletal:     Cervical back: Normal range of motion and neck supple.  Lymphadenopathy:     Cervical: No cervical adenopathy.  Skin:    General: Skin is warm and dry.  Neurological:     General: No focal deficit present.     Mental Status: She is alert and oriented to person, place, and time.     Cranial Nerves: No cranial nerve deficit.     Motor: No weakness.     Coordination: Coordination normal.     Gait: Gait normal.  Psychiatric:        Mood and Affect: Mood normal.        Behavior: Behavior normal.     Assessment and Plan :   PDMP not reviewed this encounter.  1. Influenza     Will cover for influenza with Tamiflu given exposure, symptom set, current incidence in the community.  Use supportive care, rest, fluids, hydration, light meals, schedule Tylenol and ibuprofen. Deferred imaging given  clear cardiopulmonary exam, hemodynamically stable vital signs. Counseled patient on potential for adverse effects with medications prescribed today, patient verbalized understanding. ER and return-to-clinic precautions discussed, patient verbalized understanding.    Jaynee Eagles, [place] 04/22/22 [id number]

## 2022-04-22 NOTE — Discharge Instructions (Addendum)
We will manage this for influenza with Tamiflu. For sore throat or cough try using a honey-based tea. Use 3 teaspoons of honey with juice squeezed from half lemon. Place shaved pieces of ginger into 1/2-1 cup of water and warm over stove top. Then mix the ingredients and repeat every 4 hours as needed. Please take ibuprofen 600mg  every 6 hours with food alternating with OR taken together with Tylenol 500mg -650mg  every 6 hours for throat pain, fevers, aches and pains. Hydrate very well with at least 2 liters of water. Eat light meals such as soups (chicken and noodles, vegetable, chicken and wild rice).  Do not eat foods that you are allergic to.  Taking an antihistamine like Zyrtec can help against postnasal drainage, sinus congestion which can cause sinus pain, sinus headaches, throat pain, painful swallowing, coughing.  You can take this together with pseudoephedrine (Sudafed) at a dose of 30-60 mg 3 times a day or twice daily as needed for the same kind of nasal drip, congestion.

## 2022-05-09 NOTE — Progress Notes (Unsigned)
Patient ID: Brandy Sanders, female    DOB: Nov 17, 1995  MRN: [id number]  CC: No chief complaint on file.   Subjective: Brandy Sanders is a 27 y.o. female who presents for  Her concerns today include:  lumps on back   Patient Active Problem List   Diagnosis Date Noted   Ovarian torsion 10/15/2021   Vitamin D deficiency 09/16/2021   Cholestasis of pregnancy in third trimester 06/02/2018   SVD (spontaneous vaginal delivery) 06/02/2018   Postpartum care following vaginal delivery 06/02/2018   GBS bacteriuria 12/05/2017   Seizure disorder ([place]) 10/02/2011     Current Outpatient Medications on File Prior to Visit  Medication Sig Dispense Refill   acetaminophen (TYLENOL) 325 MG tablet Take 2 tablets (650 mg total) by mouth every 4 (four) hours as needed (for pain scale < 4). (Patient not taking: Reported on 09/15/2021) 30 tablet 0   amoxicillin (AMOXIL) 875 MG tablet Take 1 tablet (875 mg total) by mouth 2 (two) times daily. 14 tablet 0   benzonatate (TESSALON) 100 MG capsule Take 1 capsule (100 mg total) by mouth 3 (three) times daily as needed for cough. 30 capsule 0   cetirizine (ZYRTEC ALLERGY) 10 MG tablet Take 1 tablet (10 mg total) by mouth daily. 30 tablet 0   ibuprofen (ADVIL) 800 MG tablet Take 1 tablet (800 mg total) by mouth every 8 (eight) hours as needed for moderate pain. 30 tablet 1   Multiple Vitamin (MULTI-VITAMIN DAILY PO) Multi Vitamin (Patient not taking: Reported on 10/15/2021)     oseltamivir (TAMIFLU) 75 MG capsule Take 1 capsule (75 mg total) by mouth 2 (two) times daily. 10 capsule 0   oxyCODONE (ROXICODONE) 5 MG immediate release tablet 1-2 tablets po q 6 hours prn severe pain 20 tablet 0   promethazine-dextromethorphan (PROMETHAZINE-DM) 6.25-15 MG/5ML syrup Take 2.5 mLs by mouth 3 (three) times daily as needed for cough. 100 mL 0   pseudoephedrine (SUDAFED) 60 MG tablet Take 1 tablet (60 mg total) by mouth every 8 (eight) hours as needed for  congestion. 30 tablet 0   Vitamin D, Ergocalciferol, (DRISDOL) 1.25 MG (50000 UNIT) CAPS capsule TAKE 1 CAPSULE BY MOUTH EVERY 7 DAYS FOR 12 DOSES 4 capsule 2   No current facility-administered medications on file prior to visit.    No Known Allergies  Social History   Socioeconomic History   Marital status: Single    Spouse name: Not on file   Number of children: Not on file   Years of education: Not on file   Highest education level: Not on file  Occupational History   Not on file  Tobacco Use   Smoking status: Never   Smokeless tobacco: Never  Vaping Use   Vaping Use: Never used  Substance and Sexual Activity   Alcohol use: Yes    Comment: occ   Drug use: No   Sexual activity: Not on file  Other Topics Concern   Not on file  Social History Narrative   Pt lives in single story home with her significant other and 2 children   Has 2 children - currently pregnant   11th grade education   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   Social Determinants of Health   Financial Resource Strain: Low Risk  (05/27/2018)   Overall Financial Resource Strain (CARDIA)    Difficulty of Paying Living Expenses: Not hard at [place]: No [place] (05/27/2018)   Hunger Vital Sign  Worried About Programme researcher, broadcasting/film/video in the Last Year: Never true    Ran Out of Food in the Last Year: Never true  Transportation Needs: Unknown (05/27/2018)   PRAPARE - Administrator, Civil Service (Medical): No    Lack of Transportation (Non-Medical): Not on file  Physical Activity: Not on file  Stress: No Stress Concern Present (05/27/2018)   [employer] of Occupational Health - Occupational Stress Questionnaire    Feeling of Stress : Only a little  Social Connections: Not on file  Intimate Partner Violence: Not At Risk (05/27/2018)   Humiliation, Afraid, Rape, and Kick questionnaire    Fear of Current or Ex-Partner: No    Emotionally Abused: No     Physically Abused: No    Sexually Abused: No    Family History  Problem Relation Age of Onset   Hypertension Mother    Diabetes Maternal Grandmother     Past Surgical History:  Procedure Laterality Date   LAPAROSCOPIC SALPINGO OOPHERECTOMY Right 10/15/2021   Procedure: LAPAROSCOPIC RIGHT SALPINGO OOPHORECTOMY;  Surgeon: Sherian Rein, MD;  Location: [facility] OR;  Service: Gynecology;  Laterality: Right;   WISDOM TOOTH EXTRACTION  2014    ROS: Review of Systems Negative except as stated above  PHYSICAL EXAM: LMP 03/31/2022   Physical Exam  {female adult master:310786} {female adult master:310785}     Latest Ref Rng & Units 10/15/2021    5:13 PM 10/15/2021    5:07 PM 09/15/2021    9:50 AM  CMP  Glucose 70 - 99 mg/dL 737  106  80   BUN 6 - 20 mg/dL 9  8  8    Creatinine 0.44 - 1.00 mg/dL  2.69  4.85   Sodium 135 - 145 mmol/L 142  139  139   Potassium 3.5 - 5.1 mmol/L 4.0  3.9  4.3   Chloride 98 - 111 mmol/L 107  112  101   CO2 22 - 32 mmol/L  21  24   Calcium 8.9 - 10.3 mg/dL  8.0  9.5   Total Protein 6.5 - 8.1 g/dL  6.5  7.7   Total Bilirubin 0.3 - 1.2 mg/dL  0.5  0.4   Alkaline Phos 38 - 126 U/L  62  158   AST 15 - 41 U/L  21  35   ALT 0 - 44 U/L  16  53    Lipid Panel     Component Value Date/Time   CHOL 150 09/15/2021 0950   TRIG 150 (H) 09/15/2021 0950   HDL 40 09/15/2021 0950   CHOLHDL 3.8 09/15/2021 0950   LDLCALC 84 09/15/2021 0950    CBC    Component Value Date/Time   WBC 10.3 10/15/2021 1707   RBC 4.39 10/15/2021 1707   HGB 11.9 (L) 10/15/2021 1713   HGB 12.8 09/15/2021 0950   HGB 13.0 09/24/2011 0000   HGB 13.0 09/24/2011 0000   HCT 35.0 (L) 10/15/2021 1713   HCT 39.2 09/15/2021 0950   HCT 40 09/24/2011 0000   HCT 40 09/24/2011 0000   PLT 204 10/15/2021 1707   PLT 267 09/15/2021 0950   PLT 166 09/24/2011 0000   MCV 86.8 10/15/2021 1707   MCV 83 09/15/2021 0950   MCH 26.2 10/15/2021 1707   MCHC 30.2 10/15/2021 1707   RDW 13.3  10/15/2021 1707   RDW 13.1 09/15/2021 0950   LYMPHSABS 2.8 09/15/2021 0950   MONOABS 0.4 11/10/2017 1752   EOSABS 0.4 09/15/2021  0950   BASOSABS 0.1 09/15/2021 0950    ASSESSMENT AND PLAN:  There are no diagnoses linked to this encounter.   Patient was given the opportunity to ask questions.  Patient verbalized understanding of the plan and was able to repeat key elements of the plan. Patient was given clear instructions to go to Emergency Department or return to medical center if symptoms don't improve, worsen, or new problems develop.The patient verbalized understanding.   No orders of the defined types were placed in this encounter.    Requested Prescriptions    No prescriptions requested or ordered in this encounter    No follow-ups on file.  Camillia Herter, NP

## 2022-05-10 ENCOUNTER — Ambulatory Visit (INDEPENDENT_AMBULATORY_CARE_PROVIDER_SITE_OTHER): Payer: Medicaid Other | Admitting: Family

## 2022-05-10 ENCOUNTER — Other Ambulatory Visit: Payer: Medicaid Other

## 2022-05-10 ENCOUNTER — Encounter: Payer: Self-pay | Admitting: Family

## 2022-05-10 ENCOUNTER — Ambulatory Visit (INDEPENDENT_AMBULATORY_CARE_PROVIDER_SITE_OTHER): Payer: Medicaid Other

## 2022-05-10 VITALS — BP 103/70 | HR 93 | Temp 98.3°F | Resp 16 | Ht 60.98 in | Wt 138.6 lb

## 2022-05-10 DIAGNOSIS — G8929 Other chronic pain: Secondary | ICD-10-CM | POA: Diagnosis not present

## 2022-05-10 DIAGNOSIS — R229 Localized swelling, mass and lump, unspecified: Secondary | ICD-10-CM | POA: Diagnosis not present

## 2022-05-10 DIAGNOSIS — M79641 Pain in right hand: Secondary | ICD-10-CM

## 2022-05-10 NOTE — Progress Notes (Signed)
Pt presents for knot on left side of back -denies pain just uncomfortable with movement

## 2022-05-10 NOTE — Patient Instructions (Signed)
Hand Pain Many things can cause hand pain. Some common causes are: An injury. Repeating the same movement with your hand over and over (overuse). Osteoporosis. Arthritis. Lumps in the tendons or joints of the hand and wrist (ganglion cysts). Nerve compression syndromes (carpal tunnel syndrome). Inflammation of the tendons (tendinitis). Infection. Follow these instructions at home: Pay attention to any changes in your symptoms. Take these actions to help with your discomfort: Managing pain, stiffness, and swelling  Take over-the-counter and prescription medicines only as told by your health care provider. Wear a hand splint or support as told by your health care provider. If directed, put ice on the affected area: Put ice in a plastic bag. Place a towel between your skin and the bag. Leave the ice on for 20 minutes, 2-3 times a day. Activity Take breaks from repetitive activity often. Avoid activities that make your pain worse. Minimize stress on your hands and wrists as much as possible. Do stretches or exercises as told by your health care provider. Do not do activities that make your pain worse. Contact a health care provider if: Your pain does not get better after a few days of self-care. Your pain gets worse. Your pain affects your ability to do your daily activities. Get help right away if: Your hand becomes warm, red, or swollen. Your hand is numb or tingling. Your hand is extremely swollen or deformed. Your hand or fingers turn white or blue. You cannot move your hand, wrist, or fingers. Summary Many things can cause hand pain. Contact your health care provider if your pain does not get better after a few days of self care. Minimize stress on your hands and wrists as much as possible. Do not do activities that make your pain worse. This information is not intended to replace advice given to you by your health care provider. Make sure you discuss any questions you have  with your health care provider. Document Revised: 07/20/2021 Document Reviewed: 07/21/2020 Elsevier Patient Education  2023 Elsevier Inc.  

## 2022-10-01 ENCOUNTER — Encounter: Payer: Self-pay | Admitting: Family Medicine

## 2022-10-01 ENCOUNTER — Ambulatory Visit (INDEPENDENT_AMBULATORY_CARE_PROVIDER_SITE_OTHER): Payer: Medicaid Other | Admitting: Family Medicine

## 2022-10-01 VITALS — BP 113/78 | HR 96 | Temp 98.1°F | Resp 20 | Ht 63.0 in | Wt 135.0 lb

## 2022-10-01 DIAGNOSIS — D649 Anemia, unspecified: Secondary | ICD-10-CM | POA: Insufficient documentation

## 2022-10-01 DIAGNOSIS — Z0000 Encounter for general adult medical examination without abnormal findings: Secondary | ICD-10-CM

## 2022-10-01 DIAGNOSIS — Z22330 Carrier of Group B streptococcus: Secondary | ICD-10-CM | POA: Insufficient documentation

## 2022-10-01 DIAGNOSIS — Z13220 Encounter for screening for lipoid disorders: Secondary | ICD-10-CM

## 2022-10-01 DIAGNOSIS — L989 Disorder of the skin and subcutaneous tissue, unspecified: Secondary | ICD-10-CM | POA: Diagnosis not present

## 2022-10-01 DIAGNOSIS — Z130 Encounter for screening for diseases of the blood and blood-forming organs and certain disorders involving the immune mechanism: Secondary | ICD-10-CM

## 2022-10-01 NOTE — Progress Notes (Signed)
Established Patient Office Visit  Subjective    Patient ID: Brandy Sanders, female    DOB: Jul 24, 1995  Age: 27 y.o. MRN: [id number]  CC:  Chief Complaint  Patient presents with   Annual Exam    HPI Brandy Sanders presents for routine annual exam. Patient denies acute complaints or concerns.    Outpatient Encounter Medications as of 10/01/2022  Medication Sig   acetaminophen (TYLENOL) 325 MG tablet Take 2 tablets (650 mg total) by mouth every 4 (four) hours as needed (for pain scale < 4). (Patient not taking: Reported on 09/15/2021)   amoxicillin (AMOXIL) 875 MG tablet Take 1 tablet (875 mg total) by mouth 2 (two) times daily. (Patient not taking: Reported on 10/01/2022)   benzonatate (TESSALON) 100 MG capsule Take 1 capsule (100 mg total) by mouth 3 (three) times daily as needed for cough. (Patient not taking: Reported on 10/01/2022)   cetirizine (ZYRTEC ALLERGY) 10 MG tablet Take 1 tablet (10 mg total) by mouth daily. (Patient not taking: Reported on 10/01/2022)   ibuprofen (ADVIL) 800 MG tablet Take 1 tablet (800 mg total) by mouth every 8 (eight) hours as needed for moderate pain. (Patient not taking: Reported on 10/01/2022)   Multiple Vitamin (MULTI-VITAMIN DAILY PO) Multi Vitamin (Patient not taking: Reported on 10/15/2021)   oseltamivir (TAMIFLU) 75 MG capsule Take 1 capsule (75 mg total) by mouth 2 (two) times daily. (Patient not taking: Reported on 10/01/2022)   oxyCODONE (ROXICODONE) 5 MG immediate release tablet 1-2 tablets po q 6 hours prn severe pain (Patient not taking: Reported on 10/01/2022)   promethazine-dextromethorphan (PROMETHAZINE-DM) 6.25-15 MG/5ML syrup Take 2.5 mLs by mouth 3 (three) times daily as needed for cough. (Patient not taking: Reported on 10/01/2022)   pseudoephedrine (SUDAFED) 60 MG tablet Take 1 tablet (60 mg total) by mouth every 8 (eight) hours as needed for congestion. (Patient not taking: Reported on 10/01/2022)   Vitamin D, Ergocalciferol,  (DRISDOL) 1.25 MG (50000 UNIT) CAPS capsule TAKE 1 CAPSULE BY MOUTH EVERY 7 DAYS FOR 12 DOSES (Patient not taking: Reported on 10/01/2022)   No facility-administered encounter medications on file as of 10/01/2022.    Past Medical History:  Diagnosis Date   Anemia    Takes Iron supplement   Cholestasis of pregnancy in third trimester    Headache    relief with Tylenol   Ovarian torsion 10/15/2021   Seizures ([facility]) 2008   Had seizure in 3rd month of first pregnancy in 2013   SVD (spontaneous vaginal delivery) 06/02/2018    Past Surgical History:  Procedure Laterality Date   LAPAROSCOPIC SALPINGO OOPHERECTOMY Right 10/15/2021   Procedure: LAPAROSCOPIC RIGHT SALPINGO OOPHORECTOMY;  Surgeon: Sherian Rein, MD;  Location: [facility] OR;  Service: Gynecology;  Laterality: Right;   WISDOM TOOTH EXTRACTION  2014    Family History  Problem Relation Age of Onset   Hypertension Mother    Diabetes Maternal Grandmother     Social History   Socioeconomic History   Marital status: Single    Spouse name: Not on file   Number of children: Not on file   Years of education: Not on file   Highest education level: Not on file  Occupational History   Not on file  Tobacco Use   Smoking status: Never    Passive exposure: Never   Smokeless tobacco: Never  Vaping Use   Vaping Use: Never used  Substance and Sexual Activity   Alcohol use: Yes    Comment: occ   Drug  use: No   Sexual activity: Not on file  Other Topics Concern   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, applications   Social Determinants of Health   Financial Resource Strain: Low Risk  (05/27/2018)   Overall Financial Resource Strain (CARDIA)    Difficulty of Paying Living Expenses: Not hard at all  Food Insecurity: No Food Insecurity (05/27/2018)    Hunger Vital Sign    Worried About Running Out of Food in the Last Year: Never true    Ran Out of Food in the Last Year: Never true  Transportation Needs: Unknown (05/27/2018)   PRAPARE - Administrator, Civil Service (Medical): No    Lack of Transportation (Non-Medical): Not on file  Physical Activity: Not on file  Stress: No Stress Concern Present (05/27/2018)   [employer] of Occupational Health - Occupational Stress Questionnaire    Feeling of Stress : Only a little  Social Connections: Not on file  Intimate Partner Violence: Not At Risk (05/27/2018)   Humiliation, Afraid, Rape, and Kick questionnaire    Fear of Current or Ex-Partner: No    Emotionally Abused: No    Physically Abused: No    Sexually Abused: No    Review of Systems  All other systems reviewed and are negative.       Objective    BP 113/78   Pulse 96   Temp 98.1 F (36.7 C) (Oral)   Resp 20   Ht 5\' 3"  (1.6 m)   Wt 135 lb (61.2 kg)   SpO2 98%   BMI 23.91 kg/m   Physical Exam Vitals and nursing note reviewed.  Constitutional:      General: She is not in acute distress. HENT:     Head: Normocephalic and atraumatic.     Right Ear: Tympanic membrane, ear canal and external ear normal.     Left Ear: Tympanic membrane, ear canal and external ear normal.     Nose: Nose normal.     Mouth/Throat:     Mouth: Mucous membranes are moist.     Pharynx: Oropharynx is clear.  Eyes:     Conjunctiva/sclera: Conjunctivae normal.     Pupils: Pupils are equal, round, and reactive to light.  Neck:     Thyroid: No thyromegaly.  Cardiovascular:     Rate and Rhythm: Normal rate and regular rhythm.     Heart sounds: Normal heart sounds. No murmur heard. Pulmonary:     Effort: Pulmonary effort is normal. No respiratory distress.     Breath sounds: Normal breath sounds.  Abdominal:     General: There is no distension.     Palpations: Abdomen is soft. There is no mass.     Tenderness: There is no  abdominal tenderness.  Musculoskeletal:        General: Normal range of motion.     Cervical back: Normal range of motion and neck supple.  Skin:    General: Skin is warm and dry.     Comments: Left posterior lower thorax with palpable small nontender, well circumscribed SQ lesion  Neurological:     General: No focal deficit present.     Mental Status: She is alert and oriented to person, place, and time.  Psychiatric:  Mood and Affect: Mood normal.        Behavior: Behavior normal.         Assessment & Plan:   1. Annual physical exam  - CMP14+EGFR  2. Screening for deficiency anemia  - CBC with Differential  3. Screening for lipid disorders  - Lipid Panel  4. Skin lesion of back Referral for further eval/mgt    Return in about 1 year (around 10/01/2023) for physical.   Tommie Raymond, MD

## 2022-10-01 NOTE — Progress Notes (Signed)
-  Patient is here to have annually  complete physical examination  -Care gap address -labs taken  

## 2022-10-02 LAB — LIPID PANEL
Chol/HDL Ratio: 2.7 ratio (ref 0.0–4.4)
Cholesterol, Total: 135 mg/dL (ref 100–199)
HDL: 50 mg/dL (ref >39)
LDL Chol Calc (NIH): 67 mg/dL (ref 0–99)
Triglycerides: 93 mg/dL (ref 0–149)
VLDL Cholesterol Cal: 18 mg/dL (ref 5–40)

## 2022-10-02 LAB — CMP14+EGFR
ALT: 43 IU/L — ABNORMAL HIGH (ref 0–32)
AST: 30 IU/L (ref 0–40)
Albumin: 4.9 g/dL (ref 4.0–5.0)
Alkaline Phosphatase: 142 IU/L — ABNORMAL HIGH (ref 44–121)
BUN/Creatinine Ratio: 16 (ref 9–23)
BUN: 10 mg/dL (ref 6–20)
Bilirubin Total: 0.3 mg/dL (ref 0.0–1.2)
CO2: 19 mmol/L — ABNORMAL LOW (ref 20–29)
Calcium: 9.3 mg/dL (ref 8.7–10.2)
Chloride: 106 mmol/L (ref 96–106)
Creatinine, Ser: 0.61 mg/dL (ref 0.57–1.00)
Globulin, Total: 3 g/dL (ref 1.5–4.5)
Glucose: 79 mg/dL (ref 70–99)
Potassium: 4.1 mmol/L (ref 3.5–5.2)
Sodium: 140 mmol/L (ref 134–144)
Total Protein: 7.9 g/dL (ref 6.0–8.5)
eGFR: 126 mL/min/{1.73_m2} (ref >59)

## 2022-10-02 LAB — CBC WITH DIFFERENTIAL/PLATELET
Basophils Absolute: 0.1 10*3/uL (ref 0.0–0.2)
Basos: 1 %
EOS (ABSOLUTE): 0.5 10*3/uL — ABNORMAL HIGH (ref 0.0–0.4)
Eos: 6 %
Hematocrit: 31.1 % — ABNORMAL LOW (ref 34.0–46.6)
Hemoglobin: 8.4 g/dL — ABNORMAL LOW (ref 11.1–15.9)
Immature Grans (Abs): 0 10*3/uL (ref 0.0–0.1)
Immature Granulocytes: 0 %
Lymphocytes Absolute: 2.8 10*3/uL (ref 0.7–3.1)
Lymphs: 32 %
MCH: 17.8 pg — ABNORMAL LOW (ref 26.6–33.0)
MCHC: 27 g/dL — ABNORMAL LOW (ref 31.5–35.7)
MCV: 66 fL — ABNORMAL LOW (ref 79–97)
Monocytes Absolute: 0.5 10*3/uL (ref 0.1–0.9)
Monocytes: 6 %
Neutrophils Absolute: 4.9 10*3/uL (ref 1.4–7.0)
Neutrophils: 55 %
Platelets: 345 10*3/uL (ref 150–450)
RBC: 4.72 x10E6/uL (ref 3.77–5.28)
RDW: 17.1 % — ABNORMAL HIGH (ref 11.7–15.4)
WBC: 8.7 10*3/uL (ref 3.4–10.8)

## 2022-10-04 ENCOUNTER — Encounter: Payer: Self-pay | Admitting: Family Medicine

## 2022-10-05 ENCOUNTER — Other Ambulatory Visit: Payer: Self-pay | Admitting: Family Medicine

## 2022-10-05 MED ORDER — IRON (FERROUS SULFATE) 325 (65 FE) MG PO TABS: 325.0000 mg | ORAL_TABLET | Freq: Two times a day (BID) | ORAL | 3 refills | Status: DC

## 2022-10-09 ENCOUNTER — Telehealth: Payer: Self-pay | Admitting: *Deleted

## 2022-10-09 NOTE — Telephone Encounter (Signed)
Pt given lab results per notes of Dr. Andrey Campanile from 10/05/22 on 10/09/22. Pt verbalized understanding. Patient reports she has been taking prescribed iron/ supplement ordered and takes 2 x day. Continues to have sx and reports nausea from medication , headaches, irritated easily. Patient would like to know if she can take medication,, iron , once a day to see if side effect decrease. Patient reports she has an OBGYN already. Instead of referral can she just call them if needed if mensus issues occur? Please advise

## 2022-10-11 NOTE — Telephone Encounter (Signed)
I have attempted without success to contact this patient by phone to return their call and I left a message on answering machine.

## 2023-03-07 ENCOUNTER — Ambulatory Visit (INDEPENDENT_AMBULATORY_CARE_PROVIDER_SITE_OTHER): Payer: Medicaid Other | Admitting: Family Medicine

## 2023-03-07 ENCOUNTER — Encounter: Payer: Self-pay | Admitting: Family Medicine

## 2023-03-07 VITALS — BP 103/72 | HR 90 | Temp 98.4°F | Resp 16 | Ht 63.0 in | Wt 132.0 lb

## 2023-03-07 DIAGNOSIS — N920 Excessive and frequent menstruation with regular cycle: Secondary | ICD-10-CM

## 2023-03-07 DIAGNOSIS — D509 Iron deficiency anemia, unspecified: Secondary | ICD-10-CM

## 2023-03-07 NOTE — Progress Notes (Signed)
Established Patient Office Visit  Subjective    Patient ID: Brandy Sanders, female    DOB: 06-06-1995  Age: 27 y.o. MRN: [id number]  CC:  Chief Complaint  Patient presents with   Follow-up    Low iron    HPI Brandy Sanders presents for follow up of anemia. She reports that she took meds as recommended until about August. She missed her follow up. The fatigue is returning. She does reports very hevay menses. She is a patient of Dr Cristopher Estimable [facility].  Outpatient Encounter Medications as of 03/07/2023  Medication Sig   Iron, Ferrous Sulfate, 325 (65 Fe) MG TABS Take 325 mg by mouth 2 (two) times daily.   acetaminophen (TYLENOL) 325 MG tablet Take 2 tablets (650 mg total) by mouth every 4 (four) hours as needed (for pain scale < 4). (Patient not taking: Reported on 09/15/2021)   amoxicillin (AMOXIL) 875 MG tablet Take 1 tablet (875 mg total) by mouth 2 (two) times daily. (Patient not taking: Reported on 10/01/2022)   benzonatate (TESSALON) 100 MG capsule Take 1 capsule (100 mg total) by mouth 3 (three) times daily as needed for cough. (Patient not taking: Reported on 10/01/2022)   cetirizine (ZYRTEC ALLERGY) 10 MG tablet Take 1 tablet (10 mg total) by mouth daily. (Patient not taking: Reported on 10/01/2022)   ibuprofen (ADVIL) 800 MG tablet Take 1 tablet (800 mg total) by mouth every 8 (eight) hours as needed for moderate pain. (Patient not taking: Reported on 10/01/2022)   Multiple Vitamin (MULTI-VITAMIN DAILY PO) Multi Vitamin (Patient not taking: Reported on 10/15/2021)   oseltamivir (TAMIFLU) 75 MG capsule Take 1 capsule (75 mg total) by mouth 2 (two) times daily. (Patient not taking: Reported on 10/01/2022)   oxyCODONE (ROXICODONE) 5 MG immediate release tablet 1-2 tablets po q 6 hours prn severe pain (Patient not taking: Reported on 10/01/2022)   promethazine-dextromethorphan (PROMETHAZINE-DM) 6.25-15 MG/5ML syrup Take 2.5 mLs by mouth 3 (three) times daily as needed for  cough. (Patient not taking: Reported on 10/01/2022)   pseudoephedrine (SUDAFED) 60 MG tablet Take 1 tablet (60 mg total) by mouth every 8 (eight) hours as needed for congestion. (Patient not taking: Reported on 10/01/2022)   Vitamin D, Ergocalciferol, (DRISDOL) 1.25 MG (50000 UNIT) CAPS capsule TAKE 1 CAPSULE BY MOUTH EVERY 7 DAYS FOR 12 DOSES (Patient not taking: Reported on 10/01/2022)   No facility-administered encounter medications on file as of 03/07/2023.    Past Medical History:  Diagnosis Date   Anemia    Takes Iron supplement   Cholestasis of pregnancy in third trimester    Headache    relief with Tylenol   Ovarian torsion 10/15/2021   Seizures ([facility]) 2008   Had seizure in 3rd month of first pregnancy in 2013   SVD (spontaneous vaginal delivery) 06/02/2018    Past Surgical History:  Procedure Laterality Date   LAPAROSCOPIC SALPINGO OOPHERECTOMY Right 10/15/2021   Procedure: LAPAROSCOPIC RIGHT SALPINGO OOPHORECTOMY;  Surgeon: Sherian Rein, MD;  Location: [facility] OR;  Service: Gynecology;  Laterality: Right;   WISDOM TOOTH EXTRACTION  2014    Family History  Problem Relation Age of Onset   Hypertension Mother    Diabetes Maternal Grandmother     Social History   Socioeconomic History   Marital status: Single    Spouse name: Not on file   Number of children: Not on file   Years of education: Not on file   Highest education level: Not on file  Occupational History  Not on file  Tobacco Use   Smoking status: Never    Passive exposure: Never   Smokeless tobacco: Never  Vaping Use   Vaping status: Never Used  Substance and Sexual Activity   Alcohol use: Yes    Comment: occ   Drug use: No   Sexual activity: Not on file  Other Topics Concern   Not on file  Social History Narrative   Pt lives in single story home with her significant other and 2 children   Has 2 children - currently pregnant   11th grade education   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, applications   Social Determinants of Health   Financial Resource Strain: Low Risk  (03/07/2023)   Overall Financial Resource Strain (CARDIA)    Difficulty of Paying Living Expenses: Not hard at all  Food Insecurity: Low Risk  (01/02/2023)   Received from [facility]   Hunger Vital Sign    Worried About Running Out of Food in the Last Year: Never true    Ran Out of Food in the Last Year: Never true  Transportation Needs: No Transportation Needs (01/02/2023)   Received from [facility]    In the past 12 months, has lack of reliable transportation kept you from medical appointments, meetings, work or from getting things needed for daily living? : No  Physical Activity: Sufficiently Active (03/07/2023)   Exercise Vital Sign    Days of Exercise per Week: 5 days    Minutes of Exercise per Session: 30 min  Stress: No Stress Concern Present (03/07/2023)   [employer] of Occupational Health - Occupational Stress Questionnaire    Feeling of Stress : Not at all  Social Connections: Moderately Isolated (03/07/2023)   Social Connection and Isolation Panel [NHANES]    Frequency of Communication with Friends and Family: More than three times a week    Frequency of Social Gatherings with Friends and Family: Three times a week    Attends Religious Services: 1 to 4 times per year    Active Member of Clubs or Organizations: No    Attends Banker Meetings: Never    Marital Status: Divorced  Catering manager Violence: Not At Risk (05/27/2018)   Humiliation, Afraid, Rape, and Kick questionnaire    Fear of Current or Ex-Partner: No    Emotionally Abused: No    Physically Abused: No    Sexually Abused: No    Review of Systems  All other systems reviewed and are negative.       Objective    BP 103/72 (BP Location: Right Arm, Patient Position: Sitting, Cuff Size: Normal)   Pulse 90   Temp 98.4 F (36.9 C) (Oral)   Resp 16   Ht 5\' 3"  (1.6 m)   Wt 132  lb (59.9 kg)   SpO2 98%   BMI 23.38 kg/m   Physical Exam Vitals and nursing note reviewed.  Constitutional:      General: She is not in acute distress. Cardiovascular:     Rate and Rhythm: Normal rate and regular rhythm.  Pulmonary:     Effort: Pulmonary effort is normal.     Breath sounds: Normal breath sounds.  Neurological:     General: No focal deficit present.     Mental Status: She is alert and oriented to person, place, and time.         Assessment & Plan:   1. Iron deficiency anemia, unspecified iron deficiency anemia  type Monitoring labs ordered and are pending.  - CBC with Differential - Iron, TIBC and Ferritin Panel  2. Menorrhagia with regular cycle Consider referral pending lab results.  - CBC with Differential - Iron, TIBC and Ferritin Panel   Return if symptoms worsen or fail to improve.   Tommie Raymond, MD

## 2023-03-08 LAB — CBC WITH DIFFERENTIAL/PLATELET
Basophils Absolute: 0.1 10*3/uL (ref 0.0–0.2)
Basos: 1 %
EOS (ABSOLUTE): 0.4 10*3/uL (ref 0.0–0.4)
Eos: 5 %
Hematocrit: 43.1 % (ref 34.0–46.6)
Hemoglobin: 13.5 g/dL (ref 11.1–15.9)
Immature Grans (Abs): 0 10*3/uL (ref 0.0–0.1)
Immature Granulocytes: 0 %
Lymphocytes Absolute: 2.3 10*3/uL (ref 0.7–3.1)
Lymphs: 33 %
MCH: 28.7 pg (ref 26.6–33.0)
MCHC: 31.3 g/dL — ABNORMAL LOW (ref 31.5–35.7)
MCV: 92 fL (ref 79–97)
Monocytes Absolute: 0.4 10*3/uL (ref 0.1–0.9)
Monocytes: 6 %
Neutrophils Absolute: 3.7 10*3/uL (ref 1.4–7.0)
Neutrophils: 55 %
Platelets: 259 10*3/uL (ref 150–450)
RBC: 4.7 x10E6/uL (ref 3.77–5.28)
RDW: 12.9 % (ref 11.7–15.4)
WBC: 6.8 10*3/uL (ref 3.4–10.8)

## 2023-03-08 LAB — IRON,TIBC AND FERRITIN PANEL
Ferritin: 14 ng/mL — ABNORMAL LOW (ref 15–150)
Iron Saturation: 13 % — ABNORMAL LOW (ref 15–55)
Iron: 60 ug/dL (ref 27–159)
Total Iron Binding Capacity: 466 ug/dL — ABNORMAL HIGH (ref 250–450)
UIBC: 406 ug/dL (ref 131–425)

## 2023-03-08 NOTE — Progress Notes (Signed)
I called and spoke with patient.  I made her aware that her labs results were clinically stable / improved and that MD Andrey Campanile wanted her to continue her present management.

## 2023-03-12 ENCOUNTER — Ambulatory Visit: Payer: Medicaid Other | Admitting: Family Medicine

## 2023-11-08 ENCOUNTER — Ambulatory Visit: Payer: Self-pay

## 2023-11-08 NOTE — Telephone Encounter (Signed)
 FYI Only or Action Required?: Action required by provider: clinical question for provider.  Patient was last seen in primary care on 03/07/2023 by Tanda Bleacher, MD.  Called Nurse Triage reporting Back Pain.  Symptoms began several weeks ago.  Interventions attempted: Nothing.  Symptoms are: gradually worsening. But not taking medications prescribed at urgent care  Triage Disposition: See PCP When Office is Open (Within 3 Days)  Patient/caregiver understands and will follow disposition?:   FYI: Patient on 7/23 at [facility] and was prescribed mobic, robaxin, norco, and prednisone . Pt says that she has not taken the meds because she didn't understand them. This RN provided patient medication education. Pt now okay to take meds prescirbed but wants to know if PCP can prescribe her Flexeril  instead of her taking the robaxin. Will route message to office.  Went ahead a schedule f/u appt at PCP for next avail, 8/27 added to waitlist  Copied from CRM #[id number]. Topic: Clinical - Red Word Triage >> Nov 08, 2023  2:43 PM Tobias L wrote: Red Word that prompted transfer to Nurse Triage: patient having back pain, muscle spasm, seen at urgent care still having back pain. Reason for Disposition  [1] MODERATE back pain (e.g., interferes with normal activities) AND [2] present > 3 days  Answer Assessment - Initial Assessment Questions 1. ONSET: When did the pain begin? (e.g., minutes, hours, days)     Started 4 days ago  2. LOCATION: Where does it hurt? (upper, mid or lower back)     Mild big shoots to hip  3. SEVERITY: How bad is the pain?  (e.g., Scale 1-10; mild, moderate, or severe)     5/10 pain  4. PATTERN: Is the pain constant? (e.g., yes, no; constant, intermittent)      Constant aches  5. RADIATION: Does the pain shoot into your legs or somewhere else?     Not radiating but feels sore  6. CAUSE:  What do you think is causing the back pain?      Unsure of cause, was seen at [facility]  on 7/23  7. BACK OVERUSE:  Any recent lifting of heavy objects, strenuous work or exercise?     Exercised last week, but nothing unsual. Went to beach over the week, but Monday   8. MEDICINES: What have you taken so far for the pain? (e.g., nothing, acetaminophen , NSAIDS)     Norco, prednisone , robaxin, mobic  9. NEUROLOGIC SYMPTOMS: Do you have any weakness, numbness, or problems with bowel/bladder control?     No  10. OTHER SYMPTOMS: Do you have any other symptoms? (e.g., fever, abdomen pain, burning with urination, blood in urine)       Pelvic pain  11. PREGNANCY: Is there any chance you are pregnant? When was your last menstrual period?       LMP 1 week ago  Protocols used: Back Pain-A-AH

## 2023-11-12 ENCOUNTER — Encounter: Payer: Self-pay | Admitting: Family

## 2023-11-12 ENCOUNTER — Ambulatory Visit (INDEPENDENT_AMBULATORY_CARE_PROVIDER_SITE_OTHER): Admitting: Family

## 2023-11-12 VITALS — BP 109/74 | HR 97 | Temp 98.4°F | Resp 16 | Ht 63.0 in | Wt 147.2 lb

## 2023-11-12 DIAGNOSIS — M545 Low back pain, unspecified: Secondary | ICD-10-CM

## 2023-11-12 NOTE — Progress Notes (Signed)
 Went to urgent care for muscle and back spasms and was still in pain

## 2023-11-12 NOTE — Progress Notes (Signed)
 Patient ID: Brandy Sanders, female    DOB: 01-Oct-1995  MRN: [id number]  CC: Urgent Care Follow-Up  Subjective: Brandy Sanders is a 28 y.o. female who presents for Urgent Care follow-up.  Her concerns today include:  Patient seen on 11/06/2023 at [facility] for acute bilateral low back pain without sciatica, workup was essentially unremarkable, and she was prescribed medications to help. Today patient states back pain persisting. Denies recent trauma/injury and red flag symptoms.  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     Multiple Vitamin (MULTI-VITAMIN DAILY PO) Multi Vitamin     predniSONE  (DELTASONE ) 10 MG tablet Take 10 mg by mouth daily with breakfast.     acetaminophen  (TYLENOL ) 325 MG tablet Take 2 tablets (650 mg total) by mouth every 4 (four) hours as needed (for pain scale < 4). (Patient not taking: Reported on 09/15/2021) 30 tablet 0   amoxicillin  (AMOXIL ) 875 MG tablet Take 1 tablet (875 mg total) by mouth 2 (two) times daily. (Patient not taking: Reported on 10/01/2022) 14 tablet 0   benzonatate  (TESSALON ) 100 MG capsule Take 1 capsule (100 mg total) by mouth 3 (three) times daily as needed for cough. (Patient not taking: Reported on 10/01/2022) 30 capsule 0   cetirizine  (ZYRTEC  ALLERGY) 10 MG tablet Take 1 tablet (10 mg total) by mouth daily. (Patient not taking: Reported  on 10/01/2022) 30 tablet 0   ibuprofen  (ADVIL ) 800 MG tablet Take 1 tablet (800 mg total) by mouth every 8 (eight) hours as needed for moderate pain. (Patient not taking: Reported on 10/01/2022) 30 tablet 1   Iron , Ferrous Sulfate , 325 (65 Fe) MG TABS Take 325 mg by mouth 2 (two) times daily. (Patient not taking: Reported on 11/12/2023) 60 tablet 3   oseltamivir  (TAMIFLU ) 75 MG capsule Take 1 capsule (75 mg total) by mouth 2 (two) times daily. (Patient not taking: Reported on 10/01/2022) 10 capsule 0   oxyCODONE  (ROXICODONE ) 5 MG immediate release tablet 1-2 tablets po q 6 hours prn severe pain (Patient not taking: Reported on 10/01/2022) 20 tablet 0   promethazine -dextromethorphan (PROMETHAZINE -DM) 6.25-15 MG/5ML syrup Take 2.5 mLs by mouth 3 (three) times daily as needed for cough. (Patient not taking: Reported on 10/01/2022) 100 mL 0   pseudoephedrine  (SUDAFED) 60 MG tablet Take 1 tablet (60 mg total) by mouth every 8 (eight) hours as needed for congestion. (Patient not taking: Reported on 10/01/2022) 30 tablet 0   Vitamin D , Ergocalciferol , (DRISDOL ) 1.25 MG (50000 UNIT) CAPS capsule TAKE 1 CAPSULE BY MOUTH EVERY 7 DAYS FOR 12 DOSES (Patient not taking: Reported on 10/01/2022) 4 capsule 2   No current facility-administered medications on file prior to visit.    No Known Allergies  Social History   Socioeconomic History   Marital status: Single    Spouse name: Not on file   Number of children: Not on file   Years  of education: Not on file   Highest education level: Not on file  Occupational History   Not on file  Tobacco Use   Smoking status: Never    Passive exposure: Never   Smokeless tobacco: Never  Vaping Use   Vaping status: Never Used  Substance and Sexual Activity   Alcohol use: Yes    Comment: occ   Drug use: No   Sexual activity: Not on file  Other Topics Concern   Not on file  Social History Narrative   Pt lives in single story home with her significant other and 2 children    Has 2 children - currently pregnant   11th grade education   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 : Never true  Transportation Needs: No Transportation Needs (01/02/2023)   Received from [facility]    In the past 12 months, has lack of reliable transportation kept you from medical appointments, meetings, work or from getting things needed for daily living? : No  Physical Activity: Sufficiently Active (03/07/2023)   Exercise Vital Sign    Days of Exercise per Week: 5 days    Minutes of Exercise per Session: 30 min  Stress: No Stress Concern Present (03/07/2023)   [employer] of Occupational Health - Occupational Stress Questionnaire    Feeling of Stress : Not at all  Social Connections: Moderately Isolated (03/07/2023)   Social Connection and Isolation Panel    Frequency of Communication with Friends and Family: More than three times a week    Frequency of Social Gatherings with Friends and Family: Three times a week    Attends Religious Services: 1 to 4 times per year    Active Member of Clubs or Organizations: No    Attends Banker Meetings: Never    Marital Status: Divorced  Catering manager Violence: Not At Risk (05/27/2018)   Humiliation, Afraid, Rape, and Kick questionnaire    Fear of Current or Ex-Partner: No    Emotionally Abused: No    Physically Abused: No    Sexually Abused: No    Family History  Problem Relation Age of  Onset   Hypertension Mother    Diabetes Maternal Grandmother     Past Surgical History:  Procedure Laterality Date   LAPAROSCOPIC SALPINGO OOPHERECTOMY Right 10/15/2021   Procedure: LAPAROSCOPIC RIGHT SALPINGO OOPHORECTOMY;  Surgeon: Danielle Rom, MD;  Location: [facility] OR;  Service: Gynecology;  Laterality: Right;   WISDOM TOOTH EXTRACTION  2014    ROS: Review of Systems Negative except as stated above  PHYSICAL EXAM: BP 109/74   Pulse 97   Temp 98.4 F (36.9 C) (Oral)   Resp 16   Ht 5' 3 (1.6 m)   Wt 147 lb 3.2 oz (66.8 kg)   LMP 10/25/2023 (Exact Date)   SpO2 97%   BMI 26.08 kg/m   Physical Exam HENT:  Head: Normocephalic and atraumatic.     Nose: Nose normal.     Mouth/Throat:     Mouth: Mucous membranes are moist.     Pharynx: Oropharynx is clear.  Eyes:     Extraocular Movements: Extraocular movements intact.     Conjunctiva/sclera: Conjunctivae normal.     Pupils: Pupils are equal, round, and reactive to light.  Cardiovascular:     Rate and Rhythm: Normal rate and regular rhythm.     Pulses: Normal pulses.     Heart sounds: Normal heart sounds.  Pulmonary:     Effort: Pulmonary effort is normal.     Breath sounds: Normal breath sounds.  Musculoskeletal:        General: Normal range of motion.     Cervical back: Normal, normal range of motion and neck supple.     Thoracic back: Normal.     Lumbar back: Normal.  Neurological:     General: No focal deficit present.     Mental Status: She is alert and oriented to person, place, and time.  Psychiatric:        Mood and Affect: Mood normal.        Behavior: Behavior normal.    ASSESSMENT AND PLAN: 1. Acute bilateral low back pain without sciatica (Primary) - Continue present management. - Referral to Orthopedic Surgery for evaluation/management. - Ambulatory referral to Orthopedic Surgery   Patient was given the opportunity to ask questions.  Patient verbalized understanding of the plan and was  able to repeat key elements of the plan. Patient was given clear instructions to go to Emergency Department or return to medical center if symptoms don't improve, worsen, or new problems develop.The patient verbalized understanding.   Orders Placed This Encounter  Procedures   Ambulatory referral to Orthopedic Surgery    Return for Follow-Up or next available with Raguel Blush, MD.  Greig JINNY Drones, NP

## 2023-11-28 ENCOUNTER — Ambulatory Visit: Admitting: Physical Medicine and Rehabilitation

## 2023-11-28 ENCOUNTER — Other Ambulatory Visit: Payer: Self-pay | Admitting: Physical Medicine and Rehabilitation

## 2023-11-28 ENCOUNTER — Encounter: Payer: Self-pay | Admitting: Physical Medicine and Rehabilitation

## 2023-11-28 DIAGNOSIS — M545 Low back pain, unspecified: Secondary | ICD-10-CM

## 2023-11-28 DIAGNOSIS — S39012A Strain of muscle, fascia and tendon of lower back, initial encounter: Secondary | ICD-10-CM | POA: Diagnosis not present

## 2023-11-28 DIAGNOSIS — M7918 Myalgia, other site: Secondary | ICD-10-CM

## 2023-11-28 MED ORDER — MELOXICAM 15 MG PO TABS: 15.0000 mg | ORAL_TABLET | Freq: Every day | ORAL | 0 refills | Status: DC

## 2023-11-28 NOTE — Progress Notes (Signed)
 Brandy Sanders - 28 y.o. female MRN [id number]  Date of birth: [birth year]  Office Visit Note: Visit Date: 11/28/2023 PCP: Tanda Bleacher, MD Referred by: Lorren Greig PARAS, NP  Subjective: Chief Complaint  Patient presents with   Middle Back - Pain   HPI: Brandy Sanders is a 28 y.o. female who comes in today per the request of Dr. Bleacher Tanda for evaluation of acute bilateral lower back pain. Pain started while on vacation at the beach in July. She was seen at [facility] on 11/06/2023. No specific aggravating factors. She describes pain as stiff, tight and sore sensation, currently rates as 2 out of 10. Some relief of pain with home exercise regimen, rest and use of medications. She was prescribed Mobic  and Prednisone  from Urgent Care, she recently finished these medications. States her pain has improved over the last several weeks. Recent lumbar radiographs show normal anatomical alignment, well preserved disc spacing. No prior lumbar MRI imaging. No history of lumbar surgery/injections. Patient denies focal weakness, numbness and tingling. No recent trauma or falls.       Review of Systems  Musculoskeletal:  Positive for back pain and myalgias.  Neurological:  Negative for tingling, sensory change, focal weakness and weakness.  All other systems reviewed and are negative.  Otherwise per HPI.  Assessment & Plan: Visit Diagnoses:    ICD-10-CM   1. Acute bilateral low back pain without sciatica  M54.50 Ambulatory referral to Physical Therapy    2. Acute myofascial strain of lumbar region, initial encounter  S39.012A Ambulatory referral to Physical Therapy    3. Myofascial pain syndrome  M79.18 Ambulatory referral to Physical Therapy       Plan: Findings:  Acute bilateral lower back pain. No radicular symptoms radiating down the legs. She continues to have pain despite good conservative therapies such as home exercise regimen, rest and use of  medications. Patients clinical presentation and exam are consistent with lumbar myofascial strain. I explained to her that lumbar strain injuries can take 4-8 weeks to heal. We discussed medication management today, I refilled Mobic  and encouraged her to try Robaxin. I also placed order for short course of formal physical therapy with a focus on manual treatments and core strengthening. I would like her to work with PT to establish home exercise regimen. I encouraged her to remain active, she can continue walking at the gym. I would like to see her back in approximately 8 weeks for re-evaluation. Should her pain persist or present as more radicular in nature would consider obtaining lumbar MRI imaging. Patient has no questions at this time. Her exam today is non focal, good strength noted to bilateral lower extremities. No red flag symptoms noted upon exam today.     Meds & Orders:  Meds ordered this encounter  Medications   meloxicam  (MOBIC ) 15 MG tablet    Sig: Take 1 tablet (15 mg total) by mouth daily.    Dispense:  30 tablet    Refill:  0    Orders Placed This Encounter  Procedures   Ambulatory referral to Physical Therapy    Follow-up: Return for 8 week follow up for re-evaluation.   Procedures: No procedures performed      Clinical History: XR SPINE LUMBAR 2-3 VIEWS, 11/06/2023 7:35 PM   INDICATION: Low back pain, unspecified \ M54.50 Low back pain, unspecified   COMPARISON: None   VIEWS: 3    IMPRESSION:   No acute fracture or traumatic malalignment. Disc  spaces are maintained.   She reports that she has never smoked. She has never been exposed to tobacco smoke. She has never used smokeless tobacco. No results for input(s): HGBA1C, LABURIC in the last 8760 hours.  Objective:  VS:  HT:    WT:   BMI:     BP:   HR: bpm  TEMP: ( )  RESP:  Physical Exam Vitals and nursing note reviewed.  HENT:     Head: Normocephalic and atraumatic.     Right Ear: External ear  normal.     Left Ear: External ear normal.     Nose: Nose normal.     Mouth/Throat:     Mouth: Mucous membranes are moist.  Eyes:     Extraocular Movements: Extraocular movements intact.  Cardiovascular:     Rate and Rhythm: Normal rate.     Pulses: Normal pulses.  Pulmonary:     Effort: Pulmonary effort is normal.  Abdominal:     General: Abdomen is flat. There is no distension.  Musculoskeletal:        General: Tenderness present.     Cervical back: Normal range of motion.     Comments: Patient rises from seated position to standing without difficulty. Good lumbar range of motion. No pain noted with facet loading. 5/5 strength noted with bilateral hip flexion, knee flexion/extension, ankle dorsiflexion/plantarflexion and EHL. No clonus noted bilaterally. No pain upon palpation of greater trochanters. No pain with internal/external rotation of bilateral hips. Sensation intact bilaterally. Myofascial tenderness noted to bilateral lumbar paraspinal regions upon palpation. Negative slump test bilaterally. Ambulates without aid, gait steady.     Skin:    General: Skin is warm and dry.     Capillary Refill: Capillary refill takes less than 2 seconds.  Neurological:     General: No focal deficit present.     Mental Status: She is alert and oriented to person, place, and time.  Psychiatric:        Mood and Affect: Mood normal.        Behavior: Behavior normal.     Ortho Exam  Imaging: No results found.  Past Medical/Family/Surgical/Social History: Medications & Allergies reviewed per EMR, new medications updated. 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   Anemia 10/01/2022   Ovarian torsion 10/15/2021   Vitamin D  deficiency 09/16/2021   Mood disorder (HCC) 09/12/2021   Cholestasis of pregnancy in third trimester 06/02/2018   SVD (spontaneous vaginal delivery) 06/02/2018   Postpartum care following vaginal delivery  06/02/2018   GBS bacteriuria 12/05/2017   Seizure disorder (HCC) 10/02/2011   Past Medical History:  Diagnosis Date   Anemia    Takes Iron  supplement   Cholestasis of pregnancy in third trimester    Headache    relief with Tylenol    Ovarian torsion 10/15/2021   Seizures (HCC) 2008   Had seizure in 3rd month of first pregnancy in 2013   SVD (spontaneous vaginal delivery) 06/02/2018   Family History  Problem Relation Age of Onset   Hypertension Mother    Diabetes Maternal Grandmother    Past Surgical History:  Procedure Laterality Date   LAPAROSCOPIC SALPINGO OOPHERECTOMY Right 10/15/2021   Procedure: LAPAROSCOPIC RIGHT SALPINGO OOPHORECTOMY;  Surgeon: Danielle Rom, MD;  Location: [facility] OR;  Service: Gynecology;  Laterality: Right;   WISDOM TOOTH EXTRACTION  2014   Social History   Occupational History   Not on file  Tobacco Use  Smoking status: Never    Passive exposure: Never   Smokeless tobacco: Never  Vaping Use   Vaping status: Never Used  Substance and Sexual Activity   Alcohol use: Yes    Comment: occ   Drug use: No   Sexual activity: Not on file

## 2023-11-28 NOTE — Progress Notes (Signed)
 Pain Scale   Average Pain 2 Patient advising pain is in middle back radiating to both hips, patient advising her pain increases when walking and standing and decreases when she sits.        +Driver, -BT, -Dye Allergies.

## 2023-11-28 NOTE — Progress Notes (Signed)
 Core Outcome Measures Index (COMI) Back Score  Average Pain 2  COMI Score 20%

## 2023-12-11 ENCOUNTER — Encounter: Payer: Self-pay | Admitting: Physical Therapy

## 2023-12-11 ENCOUNTER — Ambulatory Visit: Attending: Physical Medicine and Rehabilitation | Admitting: Physical Therapy

## 2023-12-11 ENCOUNTER — Ambulatory Visit: Admitting: Family

## 2023-12-11 ENCOUNTER — Other Ambulatory Visit: Payer: Self-pay

## 2023-12-11 DIAGNOSIS — M7989 Other specified soft tissue disorders: Secondary | ICD-10-CM | POA: Insufficient documentation

## 2023-12-11 DIAGNOSIS — M5459 Other low back pain: Secondary | ICD-10-CM | POA: Insufficient documentation

## 2023-12-11 DIAGNOSIS — M545 Low back pain, unspecified: Secondary | ICD-10-CM | POA: Insufficient documentation

## 2023-12-11 DIAGNOSIS — M7918 Myalgia, other site: Secondary | ICD-10-CM | POA: Diagnosis not present

## 2023-12-11 DIAGNOSIS — S39012A Strain of muscle, fascia and tendon of lower back, initial encounter: Secondary | ICD-10-CM | POA: Diagnosis not present

## 2023-12-11 DIAGNOSIS — M6281 Muscle weakness (generalized): Secondary | ICD-10-CM | POA: Diagnosis present

## 2023-12-11 NOTE — Therapy (Signed)
 OUTPATIENT PHYSICAL THERAPY THORACOLUMBAR EVALUATION   Patient Name: Brandy Sanders MRN: [id number] DOB:14-May-1995, 28 y.o., female Today's Date: 12/11/2023  END OF SESSION:  PT End of Session - 12/11/23 0857     Visit Number 1    Number of Visits 7    Date for PT Re-Evaluation 01/22/24    PT Start Time 0830    PT Stop Time 0900    PT Time Calculation (min) 30 min    Activity Tolerance Patient tolerated treatment well    Behavior During Therapy [facility] for tasks assessed/performed          Past Medical History:  Diagnosis Date   Anemia    Takes Iron  supplement   Cholestasis of pregnancy in third trimester    Headache    relief with Tylenol    Ovarian torsion 10/15/2021   Seizures (HCC) 2008   Had seizure in 3rd month of first pregnancy in 2013   SVD (spontaneous vaginal delivery) 06/02/2018   Past Surgical History:  Procedure Laterality Date   LAPAROSCOPIC SALPINGO OOPHERECTOMY Right 10/15/2021   Procedure: LAPAROSCOPIC RIGHT SALPINGO OOPHORECTOMY;  Surgeon: Danielle Rom, MD;  Location: [facility] OR;  Service: Gynecology;  Laterality: Right;   WISDOM TOOTH EXTRACTION  2014   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   Anemia 10/01/2022   Ovarian torsion 10/15/2021   Vitamin D  deficiency 09/16/2021   Mood disorder (HCC) 09/12/2021   Cholestasis of pregnancy in third trimester 06/02/2018   SVD (spontaneous vaginal delivery) 06/02/2018   Postpartum care following vaginal delivery 06/02/2018   GBS bacteriuria 12/05/2017   Seizure disorder (HCC) 10/02/2011    PCP: Tanda Bleacher, MD  REFERRING PROVIDER: Trudy Duwaine BRAVO, NP  REFERRING DIAG:  Diagnosis  M54.50 (ICD-10-CM) - Acute bilateral low back pain without sciatica  S39.012A (ICD-10-CM) - Acute myofascial strain of lumbar region, initial encounter  M79.18 (ICD-10-CM) - Myofascial pain syndrome    Rationale for Evaluation and Treatment:  Rehabilitation  THERAPY DIAG:  Other low back pain  Other specified soft tissue disorders  Muscle weakness (generalized)  PERTINENT HISTORY: Seizure disorder  WEIGHT BEARING RESTRICTIONS: No  FALLS:  Has patient fallen in last 6 months? No  LIVING ENVIRONMENT: Lives with: lives with their family Lives in: House/apartment Stairs: No Has following equipment at home: None  OCCUPATION: Work from home   PRECAUTIONS: None ---------------------------------------------------------------------------------------------  SUBJECTIVE:  SUBJECTIVE STATEMENT: Eval statement 12/11/2023: pain [place]. Came home July 21st , her back gave out, couldn't dress, difficult to walk as legs got weaker. Pain overall has improved alongside function starting a week or two ago. While pt has more reports of function recently, still feels limited for higher level or prolonged activity. No n/t  2/10   RED FLAGS: None    PLOF: Independent  PATIENT GOALS: reduce pain  NEXT MD VISIT: September 10th ---------------------------------------------------------------------------------------------  OBJECTIVE:  Note: Objective measures were completed at Evaluation unless otherwise noted.  DIAGNOSTIC FINDINGS:  No pertinent recent imaging in pt chart  PATIENT SURVEYS:  MODI:   COGNITION: Overall cognitive status: Within functional limits for tasks assessed   PALPATION: Tenderness to B lumbar erectors  Lumbar contraction pattern  L Multifidus:good quality contraction  R Multifidus:good quality contraction   SENSATION: WFL  MUSCLE LENGTH: Hamstrings: Right 10; deg; Left 10 deg   POSTURE: No Significant postural limitations   LUMBAR ROM:   AROM eval  Flexion WFL  Extension  WFL  Right lateral flexion WFL  Left lateral flexion WFL  Right rotation WFL  Left rotation WFL   (Blank rows = not tested)  ! Indicates pain with testing  LOWER EXTREMITY ROM:     Active  Right eval Left eval  Hip flexion    Hip extension    Hip abduction    Hip adduction    Hip internal rotation    Hip external rotation    Knee flexion    Knee extension    Ankle dorsiflexion    Ankle plantarflexion    Ankle inversion    Ankle eversion     (Blank rows = not tested)  ! Indicates pain with testing  LOWER EXTREMITY MMT:    MMT Right eval Left eval  Hip flexion    Hip extension 4+ 4+  Hip abduction 4 4  Hip adduction    Hip internal rotation    Hip external rotation    Knee flexion    Knee extension    Ankle dorsiflexion    Ankle plantarflexion    Ankle inversion    Ankle eversion     (Blank rows = not tested)   ! Indicates pain with testing LUMBAR SPECIAL TESTS:  Prone instability test: Negative, Straight leg raise test: Negative, and SI Compression/distraction test: Negative    GAIT: Distance walked: 132ft Assistive device utilized: None Level of assistance: Complete Independence Comments: [facility]  [facility] Adult PT Treatment:                                                DATE: 12/11/2023 Self Care: Pt education POC discussion  PATIENT EDUCATION:  Education details: Pt received education regarding HEP performance, ADL performance, functional activity tolerance, impairment education, appropriate performance of therapeutic activities.  Person educated: Patient Education method: Explanation, Demonstration, Tactile cues, Verbal cues, and Handouts Education comprehension: verbalized understanding and returned demonstration  HOME EXERCISE PROGRAM: Access Code: [email or web address] URL: [email or web address] Date:  12/11/2023 Prepared by: Mabel Kiang  Exercises - Supine piriformis stretch with knee extension  - 1-2 x daily - 7 x weekly - 2 sets - 20 reps - 1s hold - Prone Alternating Arm and Leg Lifts  - 1 x daily - 4 x weekly - 2-3 sets - 10 reps - 3s hold - Supine 90/90 Abdominal Bracing  - 1 x daily - 7 x weekly - 2-3 sets - 2 reps - 30s hold - Side Plank on Knees  - 1 x daily - 7 x weekly - 2-3 sets - 2 reps - 30s hold ---------------------------------------------------------------------------------------------  ASSESSMENT:  CLINICAL IMPRESSION: Eval impression (12/11/2023): Pt. attended today's physical therapy session for evaluation of low back pain. Pt has complaints of low back pain onset a little over a month ago that has been improving.pt intially stated pain started when upacking from a vacation, causing difficulties with all ADLs and ambulation. Pt has notable deficits and would benefit from theraputic focus on lumbar stability, dynamic core strength, BLE strengthening, and psotural education during lifting tasks. Signs and symptoms are concurrent with lumbar erector spinae gr 1 strain. .  Treatment performed today focused on pt education detailed in the objective. Pt demonstrated great understanding of education provided. required minimal v/t cues and no assistance for appropriate performance with today's activities. Pt requires the intervention of skilled outpatient physical therapy to address the aforementioned deficits and progress towards a functional level in line with therapeutic goals.    OBJECTIVE IMPAIRMENTS: decreased strength, improper body mechanics, postural dysfunction, and pain.   ACTIVITY LIMITATIONS: carrying and lifting  PARTICIPATION LIMITATIONS: community activity  PERSONAL FACTORS: Time since onset of injury/illness/exacerbation are also affecting patient's functional outcome.   REHAB POTENTIAL: Excellent  CLINICAL DECISION MAKING:  Stable/uncomplicated  EVALUATION COMPLEXITY: Low   GOALS: Goals reviewed with patient? YES  SHORT TERM GOALS: Target date: 01/01/2024  Pt will be independent with administered HEP to demonstrate the competency necessary for long term managemnet of symptoms at home. Baseline: Goal status: INITIAL   LONG TERM GOALS: Target date: 01/22/2024  Pt. Will achieve a MODI score of 8/50 (16%) as to demonstrate improvement in self-perceived functional ability with daily activities.  Baseline: 14/50 (28%) Goal status: INITIAL  2.  Pt will improve Global hip strength to a 5/5 to demonstrate improvement in strength for quality of motion and activity performance.  Baseline:  Goal status: INITIAL  3.  Pt will report pain levels improving during ADLs to be less than or equal to 1/10 as to demonstrate improved tolerance with daily functional activities such as gym and walking. Baseline: 2/10 Goal status: INITIAL ---------------------------------------------------------------------------------------------  PLAN:  PT FREQUENCY: 1-2x/week  PT DURATION: 6 weeks  PLANNED INTERVENTIONS: 97110-Therapeutic exercises, 97530- Therapeutic activity, 97112- Neuromuscular re-education, 97535- Self Care, 02859- Manual therapy, [phone]- Gait training, Patient/Family education, Taping, Joint mobilization, Spinal mobilization, and Moist heat.  PLAN FOR NEXT SESSION: Review HEP, Begin POC as detailed in assessment   Mabel Kiang, PT, DPT 12/11/2023, 9:03 AM

## 2023-12-18 ENCOUNTER — Encounter: Payer: Self-pay | Admitting: Physical Therapy

## 2023-12-18 ENCOUNTER — Ambulatory Visit: Attending: Physical Medicine and Rehabilitation | Admitting: Physical Therapy

## 2023-12-18 DIAGNOSIS — M5459 Other low back pain: Secondary | ICD-10-CM | POA: Diagnosis present

## 2023-12-18 DIAGNOSIS — M7989 Other specified soft tissue disorders: Secondary | ICD-10-CM | POA: Insufficient documentation

## 2023-12-18 DIAGNOSIS — M6281 Muscle weakness (generalized): Secondary | ICD-10-CM | POA: Insufficient documentation

## 2023-12-18 NOTE — Therapy (Signed)
 OUTPATIENT PHYSICAL THERAPY THORACOLUMBAR EVALUATION   Patient Name: Brandy Sanders MRN: [id number] DOB:May 01, 1995, 28 y.o., female Today's Date: 12/18/2023  END OF SESSION:  PT End of Session - 12/18/23 1203     Visit Number 2    Number of Visits 7    Date for PT Re-Evaluation 01/22/24    PT Start Time 1135    PT Stop Time 1213    PT Time Calculation (min) 38 min    Activity Tolerance Patient tolerated treatment well    Behavior During Therapy [facility] for tasks assessed/performed           Past Medical History:  Diagnosis Date   Anemia    Takes Iron  supplement   Cholestasis of pregnancy in third trimester    Headache    relief with Tylenol    Ovarian torsion 10/15/2021   Seizures (HCC) 2008   Had seizure in 3rd month of first pregnancy in 2013   SVD (spontaneous vaginal delivery) 06/02/2018   Past Surgical History:  Procedure Laterality Date   LAPAROSCOPIC SALPINGO OOPHERECTOMY Right 10/15/2021   Procedure: LAPAROSCOPIC RIGHT SALPINGO OOPHORECTOMY;  Surgeon: Danielle Rom, MD;  Location: [facility] OR;  Service: Gynecology;  Laterality: Right;   WISDOM TOOTH EXTRACTION  2014   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   Anemia 10/01/2022   Ovarian torsion 10/15/2021   Vitamin D  deficiency 09/16/2021   Mood disorder (HCC) 09/12/2021   Cholestasis of pregnancy in third trimester 06/02/2018   SVD (spontaneous vaginal delivery) 06/02/2018   Postpartum care following vaginal delivery 06/02/2018   GBS bacteriuria 12/05/2017   Seizure disorder (HCC) 10/02/2011    PCP: Tanda Bleacher, MD  REFERRING PROVIDER: Trudy Duwaine BRAVO, NP  REFERRING DIAG:  Diagnosis  M54.50 (ICD-10-CM) - Acute bilateral low back pain without sciatica  S39.012A (ICD-10-CM) - Acute myofascial strain of lumbar region, initial encounter  M79.18 (ICD-10-CM) - Myofascial pain syndrome    Rationale for Evaluation and Treatment:  Rehabilitation  THERAPY DIAG:  Other specified soft tissue disorders  Other low back pain  Muscle weakness (generalized)  PERTINENT HISTORY: Seizure disorder  WEIGHT BEARING RESTRICTIONS: No  FALLS:  Has patient fallen in last 6 months? No  LIVING ENVIRONMENT: Lives with: lives with their family Lives in: House/apartment Stairs: No Has following equipment at home: None  OCCUPATION: Work from home   PRECAUTIONS: None ---------------------------------------------------------------------------------------------  SUBJECTIVE:  SUBJECTIVE STATEMENT: Pt attended today's session with reports of 1-2/10 pain. Pt stated that they have maintained fait compliance with current HEP.  Has been going to the gym, but only performed HEP once as of yesterday.   Eval statement 12/11/2023: pain started [place]. Came home July 21st , her back gave out, couldn't dress, difficult to walk as legs got weaker. Pain overall has improved alongside function starting a week or two ago. While pt has more reports of function recently, still feels limited for higher level or prolonged activity. No n/t  2/10   RED FLAGS: None    PLOF: Independent  PATIENT GOALS: reduce pain  NEXT MD VISIT: September 10th ---------------------------------------------------------------------------------------------  OBJECTIVE:  Note: Objective measures were completed at Evaluation unless otherwise noted.  DIAGNOSTIC FINDINGS:  No pertinent recent imaging in pt chart  PATIENT SURVEYS:  MODI:   COGNITION: Overall cognitive status: Within functional limits for tasks assessed   PALPATION: Tenderness to B lumbar erectors  Lumbar contraction pattern  L Multifidus:good quality contraction  R Multifidus:good  quality contraction   SENSATION: WFL  MUSCLE LENGTH: Hamstrings: Right 10; deg; Left 10 deg   POSTURE: No Significant postural limitations   LUMBAR ROM:   AROM eval  Flexion WFL  Extension WFL  Right lateral flexion WFL  Left lateral flexion WFL  Right rotation WFL  Left rotation WFL   (Blank rows = not tested)  ! Indicates pain with testing  LOWER EXTREMITY ROM:     Active  Right eval Left eval  Hip flexion    Hip extension    Hip abduction    Hip adduction    Hip internal rotation    Hip external rotation    Knee flexion    Knee extension    Ankle dorsiflexion    Ankle plantarflexion    Ankle inversion    Ankle eversion     (Blank rows = not tested)  ! Indicates pain with testing  LOWER EXTREMITY MMT:    MMT Right eval Left eval  Hip flexion    Hip extension 4+ 4+  Hip abduction 4 4  Hip adduction    Hip internal rotation    Hip external rotation    Knee flexion    Knee extension    Ankle dorsiflexion    Ankle plantarflexion    Ankle inversion    Ankle eversion     (Blank rows = not tested)   ! Indicates pain with testing LUMBAR SPECIAL TESTS:  Prone instability test: Negative, Straight leg raise test: Negative, and SI Compression/distraction test: Negative    GAIT: Distance walked: 127ft Assistive device utilized: None Level of assistance: Complete Independence Comments: WFL  [facility] Adult PT Treatment:                                                DATE: 12/18/2023  Therapeutic Exercise: rec bike 8' Neuromuscular re-ed: Bird dog 2x12 B Standing cable axe chop 2x12B Core bracing practice with deep breathe practice  SL deadlift 2x15, 10# KB    [facility] Adult PT Treatment:  DATE: 12/11/2023 Self Care: Pt education POC discussion                                                                                                                                PATIENT EDUCATION:  Education  details: Pt received education regarding HEP performance, ADL performance, functional activity tolerance, impairment education, appropriate performance of therapeutic activities.  Person educated: Patient Education method: Explanation, Demonstration, Tactile cues, Verbal cues, and Handouts Education comprehension: verbalized understanding and returned demonstration  HOME EXERCISE PROGRAM: Access Code: [email or web address] URL: [email or web address] Date: 12/11/2023 Prepared by: Mabel Kiang  Exercises - Supine piriformis stretch with knee extension  - 1-2 x daily - 7 x weekly - 2 sets - 20 reps - 1s hold - Prone Alternating Arm and Leg Lifts  - 1 x daily - 4 x weekly - 2-3 sets - 10 reps - 3s hold - Supine 90/90 Abdominal Bracing  - 1 x daily - 7 x weekly - 2-3 sets - 2 reps - 30s hold - Side Plank on Knees  - 1 x daily - 7 x weekly - 2-3 sets - 2 reps - 30s hold ---------------------------------------------------------------------------------------------  ASSESSMENT:  CLINICAL IMPRESSION: Pt attended physical therapy session for continuation of treatment regarding low back pain. Today's treatment focused on improvement of  lumbar stbaility, lumbar motility, dynamic core strength, and core activation patterns. Pt is continuing to progress with less reports of pain during activity. Pain today still around 1-2/10. Pt showed great tolerance to administered treatment with no adverse effects by the end of session. Skilled intervention was utilized via activity modification for pt tolerance with task completion, functional progression/regression promoting best outcomes inline with current rehab goals, as well as minimal verbal/tactile cuing alongside no physical assistance for safe and appropriate performance of today's activities. Continue to progress within current POC focus.   Eval impression (12/11/2023): Pt. attended today's physical therapy session for evaluation of low back pain. Pt has  complaints of low back pain onset a little over a month ago that has been improving.pt intially stated pain started when upacking from a vacation, causing difficulties with all ADLs and ambulation. Pt has notable deficits and would benefit from theraputic focus on lumbar stability, dynamic core strength, BLE strengthening, and psotural education during lifting tasks. Signs and symptoms are concurrent with lumbar erector spinae gr 1 strain. .  Treatment performed today focused on pt education detailed in the objective. Pt demonstrated great understanding of education provided. required minimal v/t cues and no assistance for appropriate performance with today's activities. Pt requires the intervention of skilled outpatient physical therapy to address the aforementioned deficits and progress towards a functional level in line with therapeutic goals.    OBJECTIVE IMPAIRMENTS: decreased strength, improper body mechanics, postural dysfunction, and pain.   ACTIVITY LIMITATIONS: carrying and lifting  PARTICIPATION LIMITATIONS: community activity  PERSONAL FACTORS: Time since onset of injury/illness/exacerbation are also affecting patient's functional outcome.   REHAB  POTENTIAL: Excellent  CLINICAL DECISION MAKING: Stable/uncomplicated  EVALUATION COMPLEXITY: Low   GOALS: Goals reviewed with patient? YES  SHORT TERM GOALS: Target date: 01/01/2024  Pt will be independent with administered HEP to demonstrate the competency necessary for long term managemnet of symptoms at home. Baseline: Goal status: INITIAL   LONG TERM GOALS: Target date: 01/22/2024  Pt. Will achieve a MODI score of 8/50 (16%) as to demonstrate improvement in self-perceived functional ability with daily activities.  Baseline: 14/50 (28%) Goal status: INITIAL  2.  Pt will improve Global hip strength to a 5/5 to demonstrate improvement in strength for quality of motion and activity performance.  Baseline:  Goal status:  INITIAL  3.  Pt will report pain levels improving during ADLs to be less than or equal to 1/10 as to demonstrate improved tolerance with daily functional activities such as gym and walking. Baseline: 2/10 Goal status: INITIAL ---------------------------------------------------------------------------------------------  PLAN:  PT FREQUENCY: 1-2x/week  PT DURATION: 6 weeks  PLANNED INTERVENTIONS: 97110-Therapeutic exercises, 97530- Therapeutic activity, 97112- Neuromuscular re-education, 97535- Self Care, 02859- Manual therapy, [phone]- Gait training, Patient/Family education, Taping, Joint mobilization, Spinal mobilization, and Moist heat.  PLAN FOR NEXT SESSION: Review HEP, Begin POC as detailed in assessment   Mabel Kiang, PT, DPT 12/18/2023, 12:13 PM

## 2023-12-24 ENCOUNTER — Encounter: Payer: Self-pay | Admitting: Physical Therapy

## 2023-12-24 ENCOUNTER — Ambulatory Visit: Admitting: Physical Therapy

## 2023-12-24 DIAGNOSIS — M5459 Other low back pain: Secondary | ICD-10-CM

## 2023-12-24 DIAGNOSIS — M7989 Other specified soft tissue disorders: Secondary | ICD-10-CM

## 2023-12-24 DIAGNOSIS — M6281 Muscle weakness (generalized): Secondary | ICD-10-CM

## 2023-12-24 NOTE — Therapy (Addendum)
 OUTPATIENT PHYSICAL THERAPY THORACOLUMBAR EVALUATION   Patient Name: Brandy Sanders MRN: [id number] DOB:[birth year], 28 y.o., female Today's Date: 12/24/2023  END OF SESSION:  PT End of Session - 12/24/23 0909     Visit Number 3    Number of Visits 7    Date for PT Re-Evaluation 01/22/24    PT Start Time 0830    PT Stop Time 0908    PT Time Calculation (min) 38 min    Activity Tolerance Patient tolerated treatment well    Behavior During Therapy [facility] for tasks assessed/performed            Past Medical History:  Diagnosis Date   Anemia    Takes Iron  supplement   Cholestasis of pregnancy in third trimester    Headache    relief with Tylenol    Ovarian torsion 10/15/2021   Seizures (HCC) 2008   Had seizure in 3rd month of first pregnancy in 2013   SVD (spontaneous vaginal delivery) 06/02/2018   Past Surgical History:  Procedure Laterality Date   LAPAROSCOPIC SALPINGO OOPHERECTOMY Right 10/15/2021   Procedure: LAPAROSCOPIC RIGHT SALPINGO OOPHORECTOMY;  Surgeon: Danielle Rom, MD;  Location: [facility] OR;  Service: Gynecology;  Laterality: Right;   WISDOM TOOTH EXTRACTION  2014   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   Anemia 10/01/2022   Ovarian torsion 10/15/2021   Vitamin D  deficiency 09/16/2021   Mood disorder (HCC) 09/12/2021   Cholestasis of pregnancy in third trimester 06/02/2018   SVD (spontaneous vaginal delivery) 06/02/2018   Postpartum care following vaginal delivery 06/02/2018   GBS bacteriuria 12/05/2017   Seizure disorder (HCC) 10/02/2011    PCP: Tanda Bleacher, MD  REFERRING PROVIDER: Trudy Duwaine BRAVO, NP  REFERRING DIAG:  Diagnosis  M54.50 (ICD-10-CM) - Acute bilateral low back pain without sciatica  S39.012A (ICD-10-CM) - Acute myofascial strain of lumbar region, initial encounter  M79.18 (ICD-10-CM) - Myofascial pain syndrome    Rationale for Evaluation and Treatment:  Rehabilitation  THERAPY DIAG:  Other specified soft tissue disorders  Other low back pain  Muscle weakness (generalized)  PERTINENT HISTORY: Seizure disorder  WEIGHT BEARING RESTRICTIONS: No  FALLS:  Has patient fallen in last 6 months? No  LIVING ENVIRONMENT: Lives with: lives with their family Lives in: House/apartment Stairs: No Has following equipment at home: None  OCCUPATION: Work from home   PRECAUTIONS: None ---------------------------------------------------------------------------------------------  SUBJECTIVE:  SUBJECTIVE STATEMENT: Pt attended today's session with reports of 0/10 pain. Pt stated that they have maintained great compliance with current HEP.  Had a small stint of pain in the L hip for 2 days, however, went away as of this morning and is feeling really good overall.    Eval statement 12/11/2023: pain [place]. Came home July 21st , her back gave out, couldn't dress, difficult to walk as legs got weaker. Pain overall has improved alongside function starting a week or two ago. While pt has more reports of function recently, still feels limited for higher level or prolonged activity. No n/t  2/10   RED FLAGS: None    PLOF: Independent  PATIENT GOALS: reduce pain  NEXT MD VISIT: September 10th ---------------------------------------------------------------------------------------------  OBJECTIVE:  Note: Objective measures were completed at Evaluation unless otherwise noted.  DIAGNOSTIC FINDINGS:  No pertinent recent imaging in pt chart  PATIENT SURVEYS:  MODI:   COGNITION: Overall cognitive status: Within functional limits for tasks assessed   PALPATION: Tenderness to B lumbar erectors  Lumbar contraction pattern  L  Multifidus:good quality contraction  R Multifidus:good quality contraction   SENSATION: WFL  MUSCLE LENGTH: Hamstrings: Right 10; deg; Left 10 deg   POSTURE: No Significant postural limitations   LUMBAR ROM:   AROM eval  Flexion WFL  Extension WFL  Right lateral flexion WFL  Left lateral flexion WFL  Right rotation WFL  Left rotation WFL   (Blank rows = not tested)  ! Indicates pain with testing  LOWER EXTREMITY ROM:     Active  Right eval Left eval  Hip flexion    Hip extension    Hip abduction    Hip adduction    Hip internal rotation    Hip external rotation    Knee flexion    Knee extension    Ankle dorsiflexion    Ankle plantarflexion    Ankle inversion    Ankle eversion     (Blank rows = not tested)  ! Indicates pain with testing  LOWER EXTREMITY MMT:    MMT Right eval Left eval  Hip flexion    Hip extension 4+ 4+  Hip abduction 4 4  Hip adduction    Hip internal rotation    Hip external rotation    Knee flexion    Knee extension    Ankle dorsiflexion    Ankle plantarflexion    Ankle inversion    Ankle eversion     (Blank rows = not tested)   ! Indicates pain with testing LUMBAR SPECIAL TESTS:  Prone instability test: Negative, Straight leg raise test: Negative, and SI Compression/distraction test: Negative    GAIT: Distance walked: 1101ft Assistive device utilized: None Level of assistance: Complete Independence Comments: WFL  [facility] Adult PT Treatment:                                                DATE: 12/24/2023  Therapeutic Exercise: Elliptical  8' full incline Neuromuscular re-ed: Prone thoracic extension  2x6, 10s hold SL bridge 2x10 B, hold for one full breath, cue for core bracing throughout breath Monster walk 60' fwd/ 60' bkwd, GTB Hex bar DL, focus on core brace and hip hinge 2x15, 45lbs   [facility] Adult PT Treatment:  DATE: 12/18/2023  Therapeutic Exercise: rec  bike 8' Neuromuscular re-ed: Bird dog 2x12 B Standing cable axe chop 2x12B Core bracing practice with deep breathe practice  SL deadlift 2x15, 10# KB    [facility] Adult PT Treatment:                                                DATE: 12/11/2023 Self Care: Pt education POC discussion                                                                                                                                PATIENT EDUCATION:  Education details: Pt received education regarding HEP performance, ADL performance, functional activity tolerance, impairment education, appropriate performance of therapeutic activities.  Person educated: Patient Education method: Explanation, Demonstration, Tactile cues, Verbal cues, and Handouts Education comprehension: verbalized understanding and returned demonstration  HOME EXERCISE PROGRAM: Access Code: [email or web address] URL: [email or web address] Date: 12/11/2023 Prepared by: Mabel Kiang  Exercises - Supine piriformis stretch with knee extension  - 1-2 x daily - 7 x weekly - 2 sets - 20 reps - 1s hold - Prone Alternating Arm and Leg Lifts  - 1 x daily - 4 x weekly - 2-3 sets - 10 reps - 3s hold - Supine 90/90 Abdominal Bracing  - 1 x daily - 7 x weekly - 2-3 sets - 2 reps - 30s hold - Side Plank on Knees  - 1 x daily - 7 x weekly - 2-3 sets - 2 reps - 30s hold ---------------------------------------------------------------------------------------------  ASSESSMENT:  CLINICAL IMPRESSION: Pt attended physical therapy session for continuation of treatment regarding low back pain. Today's treatment focused on improvement of  lumbar stbaility, lumbar motility, hip hinge technique and core activation patterns. Pt is continuing to progress with no reports of pain during recent acitivty or during sesssion, pt also demonstrated hex bar DL with proper hip hinge and no reports of pain/pressure in the low back.  Pt showed great tolerance to  administered treatment with no adverse effects by the end of session. Skilled intervention was utilized via activity modification for pt tolerance with task completion, functional progression/regression promoting best outcomes inline with current rehab goals, as well as minimal verbal/tactile cuing alongside no physical assistance for safe and appropriate performance of today's activities. Pt was educated on pacing and plans to begin running again to test tolerance prior to next session. Consider re-eval and d/c if pt is able to run/lift at gym with no pain or discomfort.    Eval impression (12/11/2023): Pt. attended today's physical therapy session for evaluation of low back pain. Pt has complaints of low back pain onset a little over a month ago that has been improving.pt intially stated pain started when upacking from a vacation, causing difficulties with all ADLs and ambulation. Pt has notable  deficits and would benefit from theraputic focus on lumbar stability, dynamic core strength, BLE strengthening, and psotural education during lifting tasks. Signs and symptoms are concurrent with lumbar erector spinae gr 1 strain. .  Treatment performed today focused on pt education detailed in the objective. Pt demonstrated great understanding of education provided. required minimal v/t cues and no assistance for appropriate performance with today's activities. Pt requires the intervention of skilled outpatient physical therapy to address the aforementioned deficits and progress towards a functional level in line with therapeutic goals.    OBJECTIVE IMPAIRMENTS: decreased strength, improper body mechanics, postural dysfunction, and pain.   ACTIVITY LIMITATIONS: carrying and lifting  PARTICIPATION LIMITATIONS: community activity  PERSONAL FACTORS: Time since onset of injury/illness/exacerbation are also affecting patient's functional outcome.   REHAB POTENTIAL: Excellent  CLINICAL DECISION MAKING:  Stable/uncomplicated  EVALUATION COMPLEXITY: Low   GOALS: Goals reviewed with patient? YES  SHORT TERM GOALS: Target date: 01/01/2024  Pt will be independent with administered HEP to demonstrate the competency necessary for long term managemnet of symptoms at home. Baseline: Goal status: INITIAL   LONG TERM GOALS: Target date: 01/22/2024  Pt. Will achieve a MODI score of 8/50 (16%) as to demonstrate improvement in self-perceived functional ability with daily activities.  Baseline: 14/50 (28%) Goal status: INITIAL  2.  Pt will improve Global hip strength to a 5/5 to demonstrate improvement in strength for quality of motion and activity performance.  Baseline:  Goal status: INITIAL  3.  Pt will report pain levels improving during ADLs to be less than or equal to 1/10 as to demonstrate improved tolerance with daily functional activities such as gym and walking. Baseline: 2/10 Goal status: INITIAL ---------------------------------------------------------------------------------------------  PLAN:  PT FREQUENCY: 1-2x/week  PT DURATION: 6 weeks  PLANNED INTERVENTIONS: 97110-Therapeutic exercises, 97530- Therapeutic activity, 97112- Neuromuscular re-education, 97535- Self Care, 02859- Manual therapy, [phone]- Gait training, Patient/Family education, Taping, Joint mobilization, Spinal mobilization, and Moist heat.  PLAN FOR NEXT SESSION:Consider re-eval and d/c if pt is able to run/lift at gym with no pain or discomfort.   Mabel Kiang, PT, DPT 12/24/2023, 9:10 AM

## 2023-12-25 ENCOUNTER — Encounter: Admitting: Family Medicine

## 2023-12-25 ENCOUNTER — Other Ambulatory Visit: Payer: Self-pay | Admitting: Physical Medicine and Rehabilitation

## 2023-12-31 ENCOUNTER — Encounter: Admitting: Physical Therapy

## 2024-01-01 ENCOUNTER — Ambulatory Visit: Admitting: Physical Therapy

## 2024-01-01 ENCOUNTER — Encounter: Payer: Self-pay | Admitting: Physical Therapy

## 2024-01-01 DIAGNOSIS — M7989 Other specified soft tissue disorders: Secondary | ICD-10-CM | POA: Diagnosis not present

## 2024-01-01 DIAGNOSIS — M5459 Other low back pain: Secondary | ICD-10-CM

## 2024-01-01 DIAGNOSIS — M6281 Muscle weakness (generalized): Secondary | ICD-10-CM

## 2024-01-01 NOTE — Therapy (Addendum)
 OUTPATIENT PHYSICAL THERAPY THORACOLUMBAR EVALUATION   Patient Name: Brandy Sanders MRN: [id number] DOB:06-Aug-1995, 28 y.o., female Today's Date: 01/01/2024   PHYSICAL THERAPY DISCHARGE SUMMARY  Visits from Start of Care: 4  Current functional level related to goals / functional outcomes: See assessment   Remaining deficits: See assessment   Education / Equipment: See assessment   Patient agrees to discharge. Patient goals were met. Patient is being discharged due to meeting the stated rehab goals.   END OF SESSION:  PT End of Session - 01/01/24 0823     Visit Number 4    Number of Visits 7    Date for PT Re-Evaluation 01/22/24    PT Start Time 0827    PT Stop Time 0905    PT Time Calculation (min) 38 min    Activity Tolerance Patient tolerated treatment well    Behavior During Therapy [facility] for tasks assessed/performed            Past Medical History:  Diagnosis Date   Anemia    Takes Iron  supplement   Cholestasis of pregnancy in third trimester    Headache    relief with Tylenol    Ovarian torsion 10/15/2021   Seizures (HCC) 2008   Had seizure in 3rd month of first pregnancy in 2013   SVD (spontaneous vaginal delivery) 06/02/2018   Past Surgical History:  Procedure Laterality Date   LAPAROSCOPIC SALPINGO OOPHERECTOMY Right 10/15/2021   Procedure: LAPAROSCOPIC RIGHT SALPINGO OOPHORECTOMY;  Surgeon: Danielle Rom, MD;  Location: [facility] OR;  Service: Gynecology;  Laterality: Right;   WISDOM TOOTH EXTRACTION  2014   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   Anemia 10/01/2022   Ovarian torsion 10/15/2021   Vitamin D  deficiency 09/16/2021   Mood disorder (HCC) 09/12/2021   Cholestasis of pregnancy in third trimester 06/02/2018   SVD (spontaneous vaginal delivery) 06/02/2018   Postpartum care following vaginal delivery 06/02/2018   GBS bacteriuria 12/05/2017   Seizure disorder (HCC)  10/02/2011    PCP: Tanda Bleacher, MD  REFERRING PROVIDER: Trudy Duwaine BRAVO, NP  REFERRING DIAG:  Diagnosis  M54.50 (ICD-10-CM) - Acute bilateral low back pain without sciatica  S39.012A (ICD-10-CM) - Acute myofascial strain of lumbar region, initial encounter  M79.18 (ICD-10-CM) - Myofascial pain syndrome    Rationale for Evaluation and Treatment: Rehabilitation  THERAPY DIAG:  Other specified soft tissue disorders  Other low back pain  Muscle weakness (generalized)  PERTINENT HISTORY: Seizure disorder  WEIGHT BEARING RESTRICTIONS: No  FALLS:  Has patient fallen in last 6 months? No  LIVING ENVIRONMENT: Lives with: lives with their family Lives in: House/apartment Stairs: No Has following equipment at home: None  OCCUPATION: Work from home   PRECAUTIONS: None ---------------------------------------------------------------------------------------------  SUBJECTIVE:  SUBJECTIVE STATEMENT: Pt attended today's session with reports of 0/10 pain. Pt stated that they have maintained great compliance with current HEP. Was able to start running and lifting in the gym with no pain just discomfort.   Eval statement 12/11/2023: pain started [place]. Came home July 21st , her back gave out, couldn't dress, difficult to walk as legs got weaker. Pain overall has improved alongside function starting a week or two ago. While pt has more reports of function recently, still feels limited for higher level or prolonged activity. No n/t  2/10   RED FLAGS: None    PLOF: Independent  PATIENT GOALS: reduce pain  NEXT MD VISIT: September 10th ---------------------------------------------------------------------------------------------  OBJECTIVE:  Note: Objective measures  were completed at Evaluation unless otherwise noted.  DIAGNOSTIC FINDINGS:  No pertinent recent imaging in pt chart  PATIENT SURVEYS:  MODI:   COGNITION: Overall cognitive status: Within functional limits for tasks assessed   PALPATION: Tenderness to B lumbar erectors  Lumbar contraction pattern  L Multifidus:good quality contraction  R Multifidus:good quality contraction   SENSATION: WFL  MUSCLE LENGTH: Hamstrings: Right 10; deg; Left 10 deg   POSTURE: No Significant postural limitations   LUMBAR ROM:   AROM eval  Flexion WFL  Extension WFL  Right lateral flexion WFL  Left lateral flexion WFL  Right rotation WFL  Left rotation WFL   (Blank rows = not tested)  ! Indicates pain with testing  LOWER EXTREMITY ROM:     Active  Right eval Left eval  Hip flexion    Hip extension    Hip abduction    Hip adduction    Hip internal rotation    Hip external rotation    Knee flexion    Knee extension    Ankle dorsiflexion    Ankle plantarflexion    Ankle inversion    Ankle eversion     (Blank rows = not tested)  ! Indicates pain with testing  LOWER EXTREMITY MMT:    MMT Right eval Left eval  Hip flexion    Hip extension 4+ 4+  Hip abduction 4 4  Hip adduction    Hip internal rotation    Hip external rotation    Knee flexion    Knee extension    Ankle dorsiflexion    Ankle plantarflexion    Ankle inversion    Ankle eversion     (Blank rows = not tested)   ! Indicates pain with testing LUMBAR SPECIAL TESTS:  Prone instability test: Negative, Straight leg raise test: Negative, and SI Compression/distraction test: Negative    GAIT: Distance walked: 149ft Assistive device utilized: None Level of assistance: Complete Independence Comments: WFL  [facility] Adult PT Treatment:                                                DATE: 12/24/2023  Therapeutic Exercise: Elliptical  8' full incline Neuromuscular re-ed: Prone thoracic extension  2x6, 10s  hold SL bridge 2x10 B, hold for one full breath, cue for core bracing throughout breath Monster walk 60' fwd/ 60' bkwd, GTB Hex bar DL, focus on core brace and hip hinge 2x15, 45lbs   [facility] Adult PT Treatment:  DATE: 12/18/2023  Therapeutic Exercise: rec bike 8' Neuromuscular re-ed: Bird dog 2x12 B Standing cable axe chop 2x12B Core bracing practice with deep breathe practice  SL deadlift 2x15, 10# KB    [facility] Adult PT Treatment:                                                DATE: 12/11/2023 Self Care: Pt education POC discussion                                                                                                                                PATIENT EDUCATION:  Education details: Pt received education regarding HEP performance, ADL performance, functional activity tolerance, impairment education, appropriate performance of therapeutic activities.  Person educated: Patient Education method: Explanation, Demonstration, Tactile cues, Verbal cues, and Handouts Education comprehension: verbalized understanding and returned demonstration  HOME EXERCISE PROGRAM: Access Code: [email or web address] URL: [email or web address] Date: 12/11/2023 Prepared by: Mabel Kiang  Exercises - Supine piriformis stretch with knee extension  - 1-2 x daily - 7 x weekly - 2 sets - 20 reps - 1s hold - Prone Alternating Arm and Leg Lifts  - 1 x daily - 4 x weekly - 2-3 sets - 10 reps - 3s hold - Supine 90/90 Abdominal Bracing  - 1 x daily - 7 x weekly - 2-3 sets - 2 reps - 30s hold - Side Plank on Knees  - 1 x daily - 7 x weekly - 2-3 sets - 2 reps - 30s hold ---------------------------------------------------------------------------------------------  ASSESSMENT:  CLINICAL IMPRESSION: Pt attended physical therapy session for re-evaluation of LBP. Pt has met  all goals and is comfortable with continuing to progress at home using  education surrounding posture/exercise pacing and progression, as well as appropriate rest times. Pt was given educational handouts related to these topics.  Pt required minimal cuing as well as no assistance for safe and appropriate performance of today's activities Pt is appropriate for d/c at completion of today's session. Further education was given to continue applying ADL education from previous sessions as well as performing HEP as prescribed with freedom to progress as tolerated using previous education on modification and exercise dosage. Pt has displayed and verbalized competence regarding this education.     Eval impression (12/11/2023): Pt. attended today's physical therapy session for evaluation of low back pain. Pt has complaints of low back pain onset a little over a month ago that has been improving.pt intially stated pain started when upacking from a vacation, causing difficulties with all ADLs and ambulation. Pt has notable deficits and would benefit from theraputic focus on lumbar stability, dynamic core strength, BLE strengthening, and psotural education during lifting tasks. Signs and symptoms are concurrent with lumbar erector spinae gr 1 strain. .  Treatment performed today focused on pt education detailed  in the objective. Pt demonstrated great understanding of education provided. required minimal v/t cues and no assistance for appropriate performance with today's activities. Pt requires the intervention of skilled outpatient physical therapy to address the aforementioned deficits and progress towards a functional level in line with therapeutic goals.    OBJECTIVE IMPAIRMENTS: decreased strength, improper body mechanics, postural dysfunction, and pain.   ACTIVITY LIMITATIONS: carrying and lifting  PARTICIPATION LIMITATIONS: community activity  PERSONAL FACTORS: Time since onset of injury/illness/exacerbation are also affecting patient's functional outcome.   REHAB POTENTIAL:  Excellent  CLINICAL DECISION MAKING: Stable/uncomplicated  EVALUATION COMPLEXITY: Low   GOALS: Goals reviewed with patient? YES  SHORT TERM GOALS: Target date: 01/01/2024  Pt will be independent with administered HEP to demonstrate the competency necessary for long term managemnet of symptoms at home. Baseline: Goal status: MET 01/01/2024   LONG TERM GOALS: Target date: 01/22/2024  Pt. Will achieve a MODI score of 8/50 (16%) as to demonstrate improvement in self-perceived functional ability with daily activities.  Baseline: 14/50 (28%) 01/01/2024: 6/50 Goal status: MET 01/01/2024  2.  Pt will improve Global hip strength to a 5/5 to demonstrate improvement in strength for quality of motion and activity performance. Baseline:  Goal status: MET 01/01/2024  3.  Pt will report pain levels improving during ADLs to be less than or equal to 1/10 as to demonstrate improved tolerance with daily functional activities such as gym and walking. Baseline: 2/10 Goal status: MET 01/01/2024 ---------------------------------------------------------------------------------------------  PLAN:  PT FREQUENCY: 1-2x/week  PT DURATION: 6 weeks  PLANNED INTERVENTIONS: 97110-Therapeutic exercises, 97530- Therapeutic activity, 97112- Neuromuscular re-education, 97535- Self Care, 02859- Manual therapy, [phone]- Gait training, Patient/Family education, Taping, Joint mobilization, Spinal mobilization, and Moist heat.  PLAN FOR NEXT SESSION:Consider re-eval and d/c if pt is able to run/lift at gym with no pain or discomfort.   Mabel Kiang, PT, DPT 01/01/2024, 9:05 AM

## 2024-01-07 ENCOUNTER — Ambulatory Visit

## 2024-01-13 ENCOUNTER — Emergency Department (HOSPITAL_COMMUNITY)

## 2024-01-13 ENCOUNTER — Other Ambulatory Visit: Payer: Self-pay

## 2024-01-13 ENCOUNTER — Emergency Department (HOSPITAL_COMMUNITY): Admission: EM | Admit: 2024-01-13 | Discharge: 2024-01-13 | Disposition: A | Discharge status: Home / Self Care

## 2024-01-13 DIAGNOSIS — R109 Unspecified abdominal pain: Secondary | ICD-10-CM

## 2024-01-13 DIAGNOSIS — E876 Hypokalemia: Secondary | ICD-10-CM | POA: Insufficient documentation

## 2024-01-13 DIAGNOSIS — R1031 Right lower quadrant pain: Secondary | ICD-10-CM | POA: Insufficient documentation

## 2024-01-13 LAB — I-STAT CHEM 8, ED
BUN: 9 mg/dL (ref 6–20)
Calcium, Ion: 1.15 mmol/L (ref 1.15–1.40)
Chloride: 105 mmol/L (ref 98–111)
Creatinine, Ser: 0.7 mg/dL (ref 0.44–1.00)
Glucose, Bld: 130 mg/dL — ABNORMAL HIGH (ref 70–99)
HCT: 38 % (ref 36.0–46.0)
Hemoglobin: 12.9 g/dL (ref 12.0–15.0)
Potassium: 2.5 mmol/L — CL (ref 3.5–5.1)
Sodium: 143 mmol/L (ref 135–145)
TCO2: 19 mmol/L — ABNORMAL LOW (ref 22–32)

## 2024-01-13 LAB — URINALYSIS, ROUTINE W REFLEX MICROSCOPIC
Bilirubin Urine: NEGATIVE
Glucose, UA: NEGATIVE mg/dL
Hgb urine dipstick: NEGATIVE
Ketones, ur: NEGATIVE mg/dL
Leukocytes,Ua: NEGATIVE
Nitrite: NEGATIVE
Protein, ur: NEGATIVE mg/dL
Specific Gravity, Urine: 1.004 — ABNORMAL LOW (ref 1.005–1.030)
pH: 7 (ref 5.0–8.0)

## 2024-01-13 LAB — CBC WITH DIFFERENTIAL/PLATELET
Abs Immature Granulocytes: 0.05 K/uL (ref 0.00–0.07)
Basophils Absolute: 0.1 K/uL (ref 0.0–0.1)
Basophils Relative: 1 %
Eosinophils Absolute: 0.5 K/uL (ref 0.0–0.5)
Eosinophils Relative: 4 %
HCT: 39.3 % (ref 36.0–46.0)
Hemoglobin: 12.4 g/dL (ref 12.0–15.0)
Immature Granulocytes: 0 %
Lymphocytes Relative: 29 %
Lymphs Abs: 3.6 K/uL (ref 0.7–4.0)
MCH: 26.7 pg (ref 26.0–34.0)
MCHC: 31.6 g/dL (ref 30.0–36.0)
MCV: 84.7 fL (ref 80.0–100.0)
Monocytes Absolute: 0.7 K/uL (ref 0.1–1.0)
Monocytes Relative: 5 %
Neutro Abs: 7.4 K/uL (ref 1.7–7.7)
Neutrophils Relative %: 61 %
Platelets: 319 K/uL (ref 150–400)
RBC: 4.64 MIL/uL (ref 3.87–5.11)
RDW: 14.1 % (ref 11.5–15.5)
WBC: 12.3 K/uL — ABNORMAL HIGH (ref 4.0–10.5)
nRBC: 0 % (ref 0.0–0.2)

## 2024-01-13 LAB — BASIC METABOLIC PANEL WITH GFR
Anion gap: 11 (ref 5–15)
BUN: 5 mg/dL — ABNORMAL LOW (ref 6–20)
CO2: 24 mmol/L (ref 22–32)
Calcium: 8.8 mg/dL — ABNORMAL LOW (ref 8.9–10.3)
Chloride: 103 mmol/L (ref 98–111)
Creatinine, Ser: 0.6 mg/dL (ref 0.44–1.00)
GFR, Estimated: >60 mL/min (ref >60)
Glucose, Bld: 101 mg/dL — ABNORMAL HIGH (ref 70–99)
Potassium: 3.6 mmol/L (ref 3.5–5.1)
Sodium: 138 mmol/L (ref 135–145)

## 2024-01-13 LAB — COMPREHENSIVE METABOLIC PANEL WITH GFR
ALT: 18 U/L (ref 0–44)
AST: 24 U/L (ref 15–41)
Albumin: 4.2 g/dL (ref 3.5–5.0)
Alkaline Phosphatase: 68 U/L (ref 38–126)
Anion gap: 15 (ref 5–15)
BUN: 7 mg/dL (ref 6–20)
CO2: 19 mmol/L — ABNORMAL LOW (ref 22–32)
Calcium: 8.7 mg/dL — ABNORMAL LOW (ref 8.9–10.3)
Chloride: 103 mmol/L (ref 98–111)
Creatinine, Ser: 0.75 mg/dL (ref 0.44–1.00)
GFR, Estimated: >60 mL/min (ref >60)
Glucose, Bld: 129 mg/dL — ABNORMAL HIGH (ref 70–99)
Potassium: 2.5 mmol/L — CL (ref 3.5–5.1)
Sodium: 137 mmol/L (ref 135–145)
Total Bilirubin: 0.4 mg/dL (ref 0.0–1.2)
Total Protein: 7.4 g/dL (ref 6.5–8.1)

## 2024-01-13 LAB — HCG, SERUM, QUALITATIVE: Preg, Serum: NEGATIVE

## 2024-01-13 LAB — MAGNESIUM: Magnesium: 1.7 mg/dL (ref 1.7–2.4)

## 2024-01-13 MED ORDER — LACTATED RINGERS IV BOLUS
1000.0000 mL | Freq: Once | INTRAVENOUS | Status: AC
  Administered 2024-01-13: 1000 mL via INTRAVENOUS

## 2024-01-13 MED ORDER — IOHEXOL 350 MG/ML SOLN
75.0000 mL | Freq: Once | INTRAVENOUS | Status: AC | PRN
  Administered 2024-01-13: 75 mL via INTRAVENOUS

## 2024-01-13 MED ORDER — KETOROLAC TROMETHAMINE 15 MG/ML IJ SOLN
15.0000 mg | Freq: Once | INTRAMUSCULAR | Status: AC
  Administered 2024-01-13: 15 mg via INTRAVENOUS
  Filled 2024-01-13: qty 1

## 2024-01-13 MED ORDER — POTASSIUM CHLORIDE CRYS ER 20 MEQ PO TBCR
40.0000 meq | EXTENDED_RELEASE_TABLET | Freq: Once | ORAL | Status: AC
Start: 2024-01-13 — End: 2024-01-13
  Administered 2024-01-13: 40 meq via ORAL
  Filled 2024-01-13: qty 2

## 2024-01-13 MED ORDER — ONDANSETRON HCL 4 MG/2ML IJ SOLN
4.0000 mg | Freq: Once | INTRAMUSCULAR | Status: AC
Start: 2024-01-13 — End: 2024-01-13
  Administered 2024-01-13: 4 mg via INTRAVENOUS
  Filled 2024-01-13: qty 2

## 2024-01-13 MED ORDER — POTASSIUM CHLORIDE 10 MEQ/100ML IV SOLN
10.0000 meq | INTRAVENOUS | Status: DC
  Administered 2024-01-13 (×2): 10 meq via INTRAVENOUS
  Filled 2024-01-13 (×4): qty 100

## 2024-01-13 MED ORDER — ONDANSETRON HCL 4 MG/2ML IJ SOLN
4.0000 mg | Freq: Once | INTRAMUSCULAR | Status: AC
  Administered 2024-01-13: 4 mg via INTRAVENOUS

## 2024-01-13 NOTE — ED Notes (Signed)
 Patient transported to Ultrasound

## 2024-01-13 NOTE — ED Provider Notes (Signed)
 [place] EMERGENCY DEPARTMENT AT [facility] Provider Note   CSN: [id number] Arrival date & time: 01/13/24  1625     Patient presents with: No chief complaint on file.   Brandy Sanders is a 28 y.o. female.   28 year old female with past medical history of right oophorectomy secondary to ovarian torsion presenting to the emergency department today with right lower quadrant abdominal pain.  The patient states that this started this evening.  The patient states that the pain is in the right side of her abdomen.  Does not radiate.  The patient denies any vaginal bleeding or discharge.  She states that she started with this earlier today and it was mild.  Reports it is gradually worsening got worse while she was at the gym lifting some weights.  She states that she has had some nausea but denies any vomiting.  Ports she is still feeling nauseated.  She states that she felt similar when she had ovarian torsion in the past and has had the right oophorectomy.        Prior to Admission medications   Medication Sig Start Date End Date Taking? Authorizing Provider  HYDROcodone-acetaminophen  (NORCO/VICODIN) 5-325 MG tablet Take 1 tablet by mouth every 6 (six) hours as needed. 11/06/23   [provider]  meloxicam  (MOBIC ) 15 MG tablet TAKE 1 TABLET(15 MG) BY MOUTH DAILY 12/26/23   Williams, Megan E, NP  methocarbamol (ROBAXIN) 500 MG tablet Take 500 mg by mouth 4 (four) times daily. 11/06/23   [provider]  Multiple Vitamin (MULTI-VITAMIN DAILY PO) Multi Vitamin    [provider]  oseltamivir  (TAMIFLU ) 75 MG capsule Take 1 capsule (75 mg total) by mouth 2 (two) times daily. Patient not taking: Reported on 10/01/2022 04/22/22   Christopher Savannah, PA-C  oxyCODONE  (ROXICODONE ) 5 MG immediate release tablet 1-2 tablets po q 6 hours prn severe pain Patient not taking: Reported on 10/01/2022 10/16/21   Bovard-Stuckert, Jody, MD  promethazine -dextromethorphan  (PROMETHAZINE -DM) 6.25-15 MG/5ML syrup Take 2.5 mLs by mouth 3 (three) times daily as needed for cough. Patient not taking: Reported on 10/01/2022 01/14/22   Christopher Savannah, PA-C  pseudoephedrine  (SUDAFED) 60 MG tablet Take 1 tablet (60 mg total) by mouth every 8 (eight) hours as needed for congestion. Patient not taking: Reported on 10/01/2022 01/14/22   Christopher Savannah, PA-C  Vitamin D , Ergocalciferol , (DRISDOL ) 1.25 MG (50000 UNIT) CAPS capsule TAKE 1 CAPSULE BY MOUTH EVERY 7 DAYS FOR 12 DOSES Patient not taking: Reported on 10/01/2022 12/15/21   Tanda Bleacher, MD    Allergies: Patient has no known allergies.    Review of Systems  Gastrointestinal:  Positive for abdominal pain.  All other systems reviewed and are negative.   Updated Vital Signs BP 107/74   Pulse 82   Temp 98.3 F (36.8 C) (Oral)   Resp 12   SpO2 100%   Physical Exam Vitals and nursing note reviewed.   Gen: NAD Eyes: PERRL, EOMI HEENT: no oropharyngeal swelling Neck: trachea midline Resp: clear to auscultation bilaterally Card: RRR, no murmurs, rubs, or gallops Abd: Tender over the right lower quadrant with no guarding or rebound Extremities: no calf tenderness, no edema Vascular: 2+ radial pulses bilaterally, 2+ DP pulses bilaterally Skin: no rashes Psyc: acting appropriately   (all labs ordered are listed, but only abnormal results are displayed) Labs Reviewed  CBC WITH DIFFERENTIAL/PLATELET - Abnormal; Notable for the following components:      Result Value   WBC 12.3 (*)  All other components within normal limits  COMPREHENSIVE METABOLIC PANEL WITH GFR - Abnormal; Notable for the following components:   Potassium 2.5 (*)    CO2 19 (*)    Glucose, Bld 129 (*)    Calcium 8.7 (*)    All other components within normal limits  URINALYSIS, ROUTINE W REFLEX MICROSCOPIC - Abnormal; Notable for the following components:   Color, Urine STRAW (*)    Specific Gravity, Urine 1.004 (*)    All other components  within normal limits  BASIC METABOLIC PANEL WITH GFR - Abnormal; Notable for the following components:   Glucose, Bld 101 (*)    BUN 5 (*)    Calcium 8.8 (*)    All other components within normal limits  I-STAT CHEM 8, ED - Abnormal; Notable for the following components:   Potassium 2.5 (*)    Glucose, Bld 130 (*)    TCO2 19 (*)    All other components within normal limits  HCG, SERUM, QUALITATIVE  MAGNESIUM    EKG: EKG Interpretation Date/Time:  Monday January 13 2024 17:53:59 EDT Ventricular Rate:  95 PR Interval:  160 QRS Duration:  98 QT Interval:  370 QTC Calculation: 466 R Axis:   70  Text Interpretation: Sinus rhythm Confirmed by Ula Barter [phone]) on 01/13/2024 5:55:09 PM  Radiology: CT ABDOMEN PELVIS W CONTRAST Result Date: 01/13/2024 EXAM: CT ABDOMEN AND PELVIS WITH CONTRAST 01/13/2024 07:46:48 PM TECHNIQUE: CT of the abdomen and pelvis was performed with the administration of 75 mL iohexol (OMNIPAQUE) 350 MG/ML injection. Multiplanar reformatted images are provided for review. Automated exposure control, iterative reconstruction, and/or weight-based adjustment of the mA/kV was utilized to reduce the radiation dose to as low as reasonably achievable. COMPARISON: Comparison with same day pelvic ultrasound. CLINICAL HISTORY: RLQ abdominal pain. FINDINGS: LOWER CHEST: No acute abnormality. LIVER: The liver is unremarkable. GALLBLADDER AND BILE DUCTS: Gallbladder is unremarkable. No biliary ductal dilatation. SPLEEN: No acute abnormality. PANCREAS: No acute abnormality. ADRENAL GLANDS: No acute abnormality. KIDNEYS, URETERS AND BLADDER: No stones in the kidneys or ureters. No hydronephrosis. No perinephric or periureteral stranding. Urinary bladder is unremarkable. GI AND BOWEL: Stomach demonstrates no acute abnormality. There is no bowel obstruction. Normal appendix. PERITONEUM AND RETROPERITONEUM: No ascites. No free air. VASCULATURE: Aorta is normal in caliber. LYMPH NODES: No  lymphadenopathy. REPRODUCTIVE ORGANS: No acute abnormality. BONES AND SOFT TISSUES: No acute osseous abnormality. No focal soft tissue abnormality. IMPRESSION: 1. No acute abnormalities in the abdomen or pelvis. 2. Normal appendix. Electronically signed by: Norman Gatlin MD 01/13/2024 07:57 PM EDT RP Workstation: [id number]   US  Pelvis Complete Result Date: 01/13/2024 EXAM: US  Pelvis, Complete Transvaginal and Transabdominal with Doppler 01/13/2024 07:33:00 PM TECHNIQUE: Transabdominal and transvaginal pelvic duplex ultrasound using B-mode/gray scaled imaging with Doppler spectral analysis and color flow was obtained. COMPARISON: Comparison with same day CT abdomen and pelvis and pelvic ultrasound utilized 05/2021. CLINICAL HISTORY: severe R pelvic pain, h/o torsion/oophorectomy on R. FINDINGS: UTERUS: Uterus measures 9.8 x 4.2 x 5.5 cm with a volume of 118 ml. Uterus demonstrates normal myometrial echotexture. ENDOMETRIAL STRIPE: Endometrial measures 10 mm. Endometrial stripe is within normal limits. RIGHT OVARY: Right oophorectomy. LEFT OVARY: Left ovary measures 3.7 x 1.8 x 3.3 cm with a volume of 12 ml. Dominant follicle in the left ovary measures 1.7 cm. There is normal arterial and venous Doppler flow. FREE FLUID: No free fluid. IMPRESSION: 1. Unremarkable pelvic ultrasound. 2. Status post right oophorectomy. Electronically signed by: Norman Gatlin MD  01/13/2024 07:54 PM EDT RP Workstation: [id number]   US  Art/Ven Flow Abd Pelv Doppler Result Date: 01/13/2024 EXAM: US  Pelvis, Complete Transvaginal and Transabdominal with Doppler 01/13/2024 07:33:00 PM TECHNIQUE: Transabdominal and transvaginal pelvic duplex ultrasound using B-mode/gray scaled imaging with Doppler spectral analysis and color flow was obtained. COMPARISON: Comparison with same day CT abdomen and pelvis and pelvic ultrasound utilized 05/2021. CLINICAL HISTORY: severe R pelvic pain, h/o torsion/oophorectomy on R. FINDINGS: UTERUS: Uterus  measures 9.8 x 4.2 x 5.5 cm with a volume of 118 ml. Uterus demonstrates normal myometrial echotexture. ENDOMETRIAL STRIPE: Endometrial measures 10 mm. Endometrial stripe is within normal limits. RIGHT OVARY: Right oophorectomy. LEFT OVARY: Left ovary measures 3.7 x 1.8 x 3.3 cm with a volume of 12 ml. Dominant follicle in the left ovary measures 1.7 cm. There is normal arterial and venous Doppler flow. FREE FLUID: No free fluid. IMPRESSION: 1. Unremarkable pelvic ultrasound. 2. Status post right oophorectomy. Electronically signed by: Norman Gatlin MD 01/13/2024 07:54 PM EDT RP Workstation: [id number]     Procedures   Medications Ordered in the ED  lactated ringers  bolus 1,000 mL (0 mLs Intravenous Stopped 01/13/24 2140)  ondansetron  (ZOFRAN ) injection 4 mg (4 mg Intravenous Given 01/13/24 1753)  ondansetron  (ZOFRAN ) injection 4 mg (4 mg Intravenous Given 01/13/24 1758)  iohexol (OMNIPAQUE) 350 MG/ML injection 75 mL (75 mLs Intravenous Contrast Given 01/13/24 1937)  ketorolac  (TORADOL ) 15 MG/ML injection 15 mg (15 mg Intravenous Given 01/13/24 2032)  potassium chloride SA (KLOR-CON M) CR tablet 40 mEq (40 mEq Oral Given 01/13/24 2033)                                    Medical Decision Making 27 year old female with past medical history of right oophorectomy presenting to the emergency department today right lower quadrant abdominal pain.  I will further evaluate the patient here with a CT scan of her abdomen to eval for appendicitis in addition to the ultrasound ordered at triage to evaluate for ovarian torsion.  Will obtain basic labs including LFTs and lipase as well as urinalysis here as well to evaluate for hepatobiliary pathology or pancreatitis as well as UTI.  I will reevaluate for ultimate disposition.  Will give her additional Zofran  for her continued nausea.  Reports the pain has improved with the initial dose pain medication.  The patient's labs are reassuring.  Her potassium was low and  she was given oral and IV potassium.  This was rechecked and was within normal limits.  The patient CT scan and ultrasound did not show any concerning findings.  The patient is stable will be discharged with return precautions.  Amount and/or Complexity of Data Reviewed Labs: ordered. Radiology: ordered.  Risk Prescription drug management.        Final diagnoses:  Abdominal pain, unspecified abdominal location  Hypokalemia    ED Discharge Orders     None          Ula Prentice SAUNDERS, MD 01/13/24 2314

## 2024-01-13 NOTE — ED Notes (Signed)
 Pt in US  and now CT

## 2024-01-13 NOTE — ED Notes (Signed)
 Pt transported to US 

## 2024-01-13 NOTE — ED Provider Triage Note (Signed)
 Emergency Medicine Provider Triage Evaluation Note  Brandy Sanders , a 28 y.o. female  was evaluated in triage.  Pt complains of severe right sided pelvic pain.  Patient with history of ovarian torsion status post right-sided oophorectomy in July 2023 due to ovarian cyst --presents to the emergency department with worsening right sided pelvic pain with nausea that started while at the gym today.  Patient was transported by EMS.  Fentanyl  and fluids given and route.  Patient states that her pain is similar to when she had ovarian torsion.  Review of Systems  Positive: Pelvic pain Negative: Vomiting  Physical Exam  BP 127/78   Pulse 80   Temp 97.7 F (36.5 C) (Oral)   Resp 16   SpO2 100%  Gen:   Awake, appears uncomfortable Resp:  Normal effort  MSK:   Moves extremities without difficulty  Other:  Slight pallor, patient reports nausea  Medical Decision Making  Medically screening exam initiated at 4:51 PM.  Appropriate orders placed.  Brandy Sanders was informed that the remainder of the evaluation will be completed by another provider, this initial triage assessment does not replace that evaluation, and the importance of remaining in the ED until their evaluation is complete.  Lab work, pelvic ultrasound ordered to evaluate right sided pelvis and ensure left ovary appears normal.   Desiderio Chew, PA-C 01/13/24 1653

## 2024-01-13 NOTE — ED Notes (Signed)
 CT tech notified pt has a 2nd line for imaging.

## 2024-01-13 NOTE — Discharge Instructions (Signed)
 Your workup today was reassuring.  Please take your meloxicam  or Tylenol  at home as needed for pain.  Follow-up with your doctor for reevaluation and return to the ER for worsening symptoms.

## 2024-01-13 NOTE — ED Triage Notes (Addendum)
 Pt bib ems; this afternoon pt began having pelvic pain while doing physical therapy at gym for back; using leg press machine, approx 60 lbs; pain worsening; pt had oophorectomy x 2 years; 100 mcg fentanyl , 500 cc bolus given pta; 20 ga lac; hr 120, 126/72; pt c/o nausea and feeling lightheaded; denies cp, denies sob

## 2024-01-23 ENCOUNTER — Ambulatory Visit: Admitting: Physical Medicine and Rehabilitation

## 2024-01-23 ENCOUNTER — Encounter: Payer: Self-pay | Admitting: Physical Medicine and Rehabilitation

## 2024-01-23 DIAGNOSIS — M25551 Pain in right hip: Secondary | ICD-10-CM

## 2024-01-23 DIAGNOSIS — M25552 Pain in left hip: Secondary | ICD-10-CM

## 2024-01-23 DIAGNOSIS — M5416 Radiculopathy, lumbar region: Secondary | ICD-10-CM | POA: Diagnosis not present

## 2024-01-23 DIAGNOSIS — M5441 Lumbago with sciatica, right side: Secondary | ICD-10-CM | POA: Diagnosis not present

## 2024-01-23 DIAGNOSIS — M5442 Lumbago with sciatica, left side: Secondary | ICD-10-CM

## 2024-01-23 NOTE — Progress Notes (Signed)
 Pain Scale   Average Pain 4 Patient advising she still having lower back pain radiating to bilateral hip areas patient advising PT did help, however she is advising she feels she re injured her lower back.        +Driver, -BT, -Dye Allergies.

## 2024-01-23 NOTE — Progress Notes (Signed)
 Brandy Sanders - 28 y.o. female MRN [id number]  Date of birth: [birth year]  Office Visit Note: Visit Date: 01/23/2024 PCP: Tanda Bleacher, MD Referred by: Tanda Bleacher, MD  Subjective: Chief Complaint  Patient presents with   Lower Back - Pain   HPI: Brandy Sanders is a 28 y.o. female who comes in today for evaluation of acute lower back pain. She is here today for follow up post physical therapy. States her pain did improve initially with PT treatments, however her pain has now changed and increased over the last 2 weeks. She now reports lower back pain radiating to lateral hips, states this pain started while working out in the gym. Some relief of pain with Mobic  and Robaxin. She was seen in the emergency room on 01/13/2024. She was evaluated for more right lower quadrant abdominal pain. Recent CT of abdomen and pelvis was normal. She does have history of ovarian torsion and right oophorectomy. She was also seen at [facility] on 01/15/2024 for same issue. She was prescribed Robaxin and Naproxen. Lumbar MRI imaging from 2022 shows mild disc bulge at L4-L5 with central protrusion and annular fissure. No spinal canal stenosis or neural foraminal narrowing. Patient denies focal weakness, numbness and tingling. No recent trauma or falls.         Review of Systems  Musculoskeletal:  Positive for back pain.  Neurological:  Negative for tingling, sensory change, focal weakness and weakness.  All other systems reviewed and are negative.  Otherwise per HPI.  Assessment & Plan: Visit Diagnoses:    ICD-10-CM   1. Acute bilateral low back pain with bilateral sciatica  M54.42 MR LUMBAR SPINE WO CONTRAST   M54.41     2. Lumbar radiculopathy  M54.16 MR LUMBAR SPINE WO CONTRAST    3. Bilateral hip pain  M25.551 MR LUMBAR SPINE WO CONTRAST   M25.552        Plan: Findings:  Acute bilateral lower back pain radiating to lateral hips.  Her lower back pain did  initially improve with several sessions of formal physical therapy and medications.  Her pain returned about 2 weeks ago while exercising and now radiates to her lateral hips.  Patient's clinical presentation and exam are complex, could be more of a lumbar radiculopathy.  There is no tenderness noted upon palpation of greater trochanter regions today.  No pain noted with internal/external rotation of bilateral hips.  We discussed treatment plan in detail today.  Next step is to place order for new lumbar MRI imaging.  There was a central disc protrusion and annular fissure at the level of L4-L5 in 2022 that could be a pain generator.  I instructed patient to continue with Robaxin and naproxen as needed.  She has no questions at this time.  I will see her back for lumbar MRI review.    Meds & Orders: No orders of the defined types were placed in this encounter.   Orders Placed This Encounter  Procedures   MR LUMBAR SPINE WO CONTRAST    Follow-up: Return for Lumbar MRI review.   Procedures: No procedures performed      Clinical History: XR SPINE LUMBAR 2-3 VIEWS, 11/06/2023 7:35 PM   INDICATION: Low back pain, unspecified \ M54.50 Low back pain, unspecified   COMPARISON: None   VIEWS: 3    IMPRESSION:   No acute fracture or traumatic malalignment. Disc spaces are maintained.   She reports that she has never smoked. She has never been  exposed to tobacco smoke. She has never used smokeless tobacco. No results for input(s): HGBA1C, LABURIC in the last 8760 hours.  Objective:  VS:  HT:    WT:   BMI:     BP:   HR: bpm  TEMP: ( )  RESP:  Physical Exam Vitals and nursing note reviewed.  HENT:     Head: Normocephalic and atraumatic.     Right Ear: External ear normal.     Left Ear: External ear normal.     Nose: Nose normal.     Mouth/Throat:     Mouth: Mucous membranes are moist.  Eyes:     Extraocular Movements: Extraocular movements intact.  Cardiovascular:     Rate and  Rhythm: Normal rate.     Pulses: Normal pulses.  Pulmonary:     Effort: Pulmonary effort is normal.  Abdominal:     General: Abdomen is flat. There is no distension.  Musculoskeletal:        General: Tenderness present.     Cervical back: Normal range of motion.     Comments: Patient rises from seated position to standing without difficulty. Good lumbar range of motion. No pain noted with facet loading. 5/5 strength noted with bilateral hip flexion, knee flexion/extension, ankle dorsiflexion/plantarflexion and EHL. No clonus noted bilaterally. No pain upon palpation of greater trochanters. No pain with internal/external rotation of bilateral hips. Sensation intact bilaterally. Negative slump test bilaterally. Ambulates without aid, gait steady.     Skin:    General: Skin is warm and dry.     Capillary Refill: Capillary refill takes less than 2 seconds.  Neurological:     General: No focal deficit present.     Mental Status: She is alert and oriented to person, place, and time.  Psychiatric:        Mood and Affect: Mood normal.        Behavior: Behavior normal.     Ortho Exam  Imaging: No results found.  Past Medical/Family/Surgical/Social History: Medications & Allergies reviewed per EMR, new medications updated. 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   Anemia 10/01/2022   Ovarian torsion 10/15/2021   Vitamin D  deficiency 09/16/2021   Mood disorder 09/12/2021   Cholestasis of pregnancy in third trimester 06/02/2018   SVD (spontaneous vaginal delivery) 06/02/2018   Postpartum care following vaginal delivery 06/02/2018   GBS bacteriuria 12/05/2017   Seizure disorder (HCC) 10/02/2011   Past Medical History:  Diagnosis Date   Anemia    Takes Iron  supplement   Cholestasis of pregnancy in third trimester    Headache    relief with Tylenol    Ovarian torsion 10/15/2021   Seizures (HCC) 2008   Had seizure in 3rd month  of first pregnancy in 2013   SVD (spontaneous vaginal delivery) 06/02/2018   Family History  Problem Relation Age of Onset   Hypertension Mother    Diabetes Maternal Grandmother    Past Surgical History:  Procedure Laterality Date   LAPAROSCOPIC SALPINGO OOPHERECTOMY Right 10/15/2021   Procedure: LAPAROSCOPIC RIGHT SALPINGO OOPHORECTOMY;  Surgeon: Danielle Rom, MD;  Location: [facility] OR;  Service: Gynecology;  Laterality: Right;   WISDOM TOOTH EXTRACTION  2014   Social History   Occupational History   Not on file  Tobacco Use   Smoking status: Never    Passive exposure: Never   Smokeless tobacco: Never  Vaping Use   Vaping status: Never Used  Substance  and Sexual Activity   Alcohol use: Yes    Comment: occ   Drug use: No   Sexual activity: Not on file

## 2024-02-04 ENCOUNTER — Encounter: Payer: Self-pay | Admitting: Family Medicine

## 2024-02-04 ENCOUNTER — Ambulatory Visit (INDEPENDENT_AMBULATORY_CARE_PROVIDER_SITE_OTHER): Admitting: Family Medicine

## 2024-02-04 VITALS — BP 100/62 | HR 72 | Ht 63.0 in | Wt 148.0 lb

## 2024-02-04 DIAGNOSIS — Z136 Encounter for screening for cardiovascular disorders: Secondary | ICD-10-CM | POA: Diagnosis not present

## 2024-02-04 DIAGNOSIS — Z0000 Encounter for general adult medical examination without abnormal findings: Secondary | ICD-10-CM | POA: Diagnosis not present

## 2024-02-04 DIAGNOSIS — Z130 Encounter for screening for diseases of the blood and blood-forming organs and certain disorders involving the immune mechanism: Secondary | ICD-10-CM | POA: Diagnosis not present

## 2024-02-04 DIAGNOSIS — Z13228 Encounter for screening for other metabolic disorders: Secondary | ICD-10-CM

## 2024-02-04 DIAGNOSIS — Z1329 Encounter for screening for other suspected endocrine disorder: Secondary | ICD-10-CM

## 2024-02-04 NOTE — Progress Notes (Signed)
 Established Patient Office Visit  Subjective    Patient ID: Brandy Sanders, female    DOB: Aug 20, 1995  Age: 28 y.o. MRN: [id number]  CC:  Chief Complaint  Patient presents with   Annual Exam    HPI Brandy Sanders presents for routine annual exam. Patient denies acute complaints.   Outpatient Encounter Medications as of 02/04/2024  Medication Sig   HYDROcodone-acetaminophen  (NORCO/VICODIN) 5-325 MG tablet Take 1 tablet by mouth every 6 (six) hours as needed.   meloxicam  (MOBIC ) 15 MG tablet TAKE 1 TABLET(15 MG) BY MOUTH DAILY (Patient not taking: Reported on 02/04/2024)   methocarbamol (ROBAXIN) 500 MG tablet Take 500 mg by mouth 4 (four) times daily. (Patient not taking: Reported on 02/04/2024)   Multiple Vitamin (MULTI-VITAMIN DAILY PO) Multi Vitamin   oseltamivir  (TAMIFLU ) 75 MG capsule Take 1 capsule (75 mg total) by mouth 2 (two) times daily. (Patient not taking: Reported on 10/01/2022)   oxyCODONE  (ROXICODONE ) 5 MG immediate release tablet 1-2 tablets po q 6 hours prn severe pain (Patient not taking: Reported on 10/01/2022)   promethazine -dextromethorphan (PROMETHAZINE -DM) 6.25-15 MG/5ML syrup Take 2.5 mLs by mouth 3 (three) times daily as needed for cough. (Patient not taking: Reported on 10/01/2022)   pseudoephedrine  (SUDAFED) 60 MG tablet Take 1 tablet (60 mg total) by mouth every 8 (eight) hours as needed for congestion. (Patient not taking: Reported on 10/01/2022)   Vitamin D , Ergocalciferol , (DRISDOL ) 1.25 MG (50000 UNIT) CAPS capsule TAKE 1 CAPSULE BY MOUTH EVERY 7 DAYS FOR 12 DOSES (Patient not taking: Reported on 10/01/2022)   No facility-administered encounter medications on file as of 02/04/2024.    Past Medical History:  Diagnosis Date   Anemia    Takes Iron  supplement   Cholestasis of pregnancy in third trimester    Headache    relief with Tylenol    Ovarian torsion 10/15/2021   Seizures (HCC) 2008   Had seizure in 3rd month of first pregnancy in 2013    SVD (spontaneous vaginal delivery) 06/02/2018    Past Surgical History:  Procedure Laterality Date   LAPAROSCOPIC SALPINGO OOPHERECTOMY Right 10/15/2021   Procedure: LAPAROSCOPIC RIGHT SALPINGO OOPHORECTOMY;  Surgeon: Danielle Rom, MD;  Location: [facility] OR;  Service: Gynecology;  Laterality: Right;   WISDOM TOOTH EXTRACTION  2014    Family History  Problem Relation Age of Onset   Hypertension Mother    Diabetes Maternal Grandmother     Social History   Socioeconomic History   Marital status: Single    Spouse name: Not on file   Number of children: Not on file   Years of education: Not on file   Highest education level: 12th grade  Occupational History   Not on file  Tobacco Use   Smoking status: Never    Passive exposure: Never   Smokeless tobacco: Never  Vaping Use   Vaping status: Never Used  Substance and Sexual Activity   Alcohol use: Yes    Comment: occ   Drug use: No   Sexual activity: Not on file  Other Topics Concern   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, applications   Social Drivers of Health   Financial Resource Strain: Low Risk  (02/03/2024)   Overall Financial Resource Strain (CARDIA)    Difficulty of Paying Living  Expenses: Not hard at all  Food Insecurity: No Food Insecurity (02/03/2024)   Hunger Vital Sign    Worried About Running Out of Food in the Last Year: Never true    Ran Out of Food in the Last Year: Never true  Transportation Needs: No Transportation Needs (02/03/2024)   PRAPARE - Administrator, Civil Service (Medical): No    Lack of Transportation (Non-Medical): No  Physical Activity: Sufficiently Active (02/03/2024)   Exercise Vital Sign    Days of Exercise per Week: 5 days    Minutes of Exercise per Session: 30 min  Stress: No Stress  Concern Present (02/03/2024)   [employer] of Occupational Health - Occupational Stress Questionnaire    Feeling of Stress: Only a little  Social Connections: Moderately Integrated (02/03/2024)   Social Connection and Isolation Panel    Frequency of Communication with Friends and Family: More than three times a week    Frequency of Social Gatherings with Friends and Family: Three times a week    Attends Religious Services: More than 4 times per year    Active Member of Clubs or Organizations: No    Attends Banker Meetings: Not on file    Marital Status: Married  Intimate Partner Violence: Not At Risk (05/27/2018)   Humiliation, Afraid, Rape, and Kick questionnaire    Fear of Current or Ex-Partner: No    Emotionally Abused: No    Physically Abused: No    Sexually Abused: No    Review of Systems  All other systems reviewed and are negative.       Objective    BP 100/62   Pulse 72   Ht 5' 3 (1.6 m)   Wt 148 lb (67.1 kg)   LMP 02/04/2024   SpO2 98%   BMI 26.22 kg/m   Physical Exam Vitals and nursing note reviewed.  Constitutional:      General: She is not in acute distress. HENT:     Head: Normocephalic and atraumatic.     Right Ear: Tympanic membrane, ear canal and external ear normal.     Left Ear: Tympanic membrane, ear canal and external ear normal.     Nose: Nose normal.     Mouth/Throat:     Mouth: Mucous membranes are moist.     Pharynx: Oropharynx is clear.  Eyes:     Conjunctiva/sclera: Conjunctivae normal.     Pupils: Pupils are equal, round, and reactive to light.  Neck:     Thyroid: No thyromegaly.  Cardiovascular:     Rate and Rhythm: Normal rate and regular rhythm.     Heart sounds: Normal heart sounds. No murmur heard. Pulmonary:     Effort: Pulmonary effort is normal. No respiratory distress.     Breath sounds: Normal breath sounds.  Abdominal:     General: There is no distension.     Palpations: Abdomen is soft. There is  no mass.     Tenderness: There is no abdominal tenderness.  Musculoskeletal:        General: Normal range of motion.     Cervical back: Normal range of motion and neck supple.  Skin:    General: Skin is warm and dry.  Neurological:     General: No focal deficit present.     Mental Status: She is alert and oriented to person, place, and time.  Psychiatric:        Mood and Affect: Mood normal.  Behavior: Behavior normal.         Assessment & Plan:   Annual physical exam -     Hemoglobin A1c  Screening for deficiency anemia -     CBC with Differential/Platelet  Encounter for screening for cardiovascular disorders -     Lipid panel  Screening for endocrine/metabolic/immunity disorders -     Comprehensive metabolic panel with GFR     No follow-ups on file.   Tanda Raguel SQUIBB, MD

## 2024-02-05 LAB — COMPREHENSIVE METABOLIC PANEL WITH GFR
ALT: 23 IU/L (ref 0–32)
AST: 19 IU/L (ref 0–40)
Albumin: 4.7 g/dL (ref 4.0–5.0)
Alkaline Phosphatase: 90 IU/L (ref 41–116)
BUN/Creatinine Ratio: 21 (ref 9–23)
BUN: 13 mg/dL (ref 6–20)
Bilirubin Total: 0.4 mg/dL (ref 0.0–1.2)
CO2: 24 mmol/L (ref 20–29)
Calcium: 9.5 mg/dL (ref 8.7–10.2)
Chloride: 104 mmol/L (ref 96–106)
Creatinine, Ser: 0.61 mg/dL (ref 0.57–1.00)
Globulin, Total: 2.7 g/dL (ref 1.5–4.5)
Glucose: 90 mg/dL (ref 70–99)
Potassium: 4.2 mmol/L (ref 3.5–5.2)
Sodium: 141 mmol/L (ref 134–144)
Total Protein: 7.4 g/dL (ref 6.0–8.5)
eGFR: 124 mL/min/1.73 (ref >59)

## 2024-02-05 LAB — CBC WITH DIFFERENTIAL/PLATELET
Basophils Absolute: 0 x10E3/uL (ref 0.0–0.2)
Basos: 1 %
EOS (ABSOLUTE): 0.3 x10E3/uL (ref 0.0–0.4)
Eos: 5 %
Hematocrit: 39.4 % (ref 34.0–46.6)
Hemoglobin: 12.2 g/dL (ref 11.1–15.9)
Immature Grans (Abs): 0 x10E3/uL (ref 0.0–0.1)
Immature Granulocytes: 0 %
Lymphocytes Absolute: 2 x10E3/uL (ref 0.7–3.1)
Lymphs: 33 %
MCH: 26.7 pg (ref 26.6–33.0)
MCHC: 31 g/dL — ABNORMAL LOW (ref 31.5–35.7)
MCV: 86 fL (ref 79–97)
Monocytes Absolute: 0.4 x10E3/uL (ref 0.1–0.9)
Monocytes: 7 %
Neutrophils Absolute: 3.3 x10E3/uL (ref 1.4–7.0)
Neutrophils: 54 %
Platelets: 263 x10E3/uL (ref 150–450)
RBC: 4.57 x10E6/uL (ref 3.77–5.28)
RDW: 13.8 % (ref 11.7–15.4)
WBC: 6.1 x10E3/uL (ref 3.4–10.8)

## 2024-02-05 LAB — LIPID PANEL
Chol/HDL Ratio: 4.1 ratio (ref 0.0–4.4)
Cholesterol, Total: 162 mg/dL (ref 100–199)
HDL: 40 mg/dL (ref >39)
LDL Chol Calc (NIH): 101 mg/dL — ABNORMAL HIGH (ref 0–99)
Triglycerides: 113 mg/dL (ref 0–149)
VLDL Cholesterol Cal: 21 mg/dL (ref 5–40)

## 2024-02-05 LAB — HEMOGLOBIN A1C
Est. average glucose Bld gHb Est-mCnc: 103 mg/dL
Hgb A1c MFr Bld: 5.2 % (ref 4.8–5.6)

## 2024-02-08 ENCOUNTER — Ambulatory Visit
Admission: RE | Admit: 2024-02-08 | Discharge: 2024-02-08 | Disposition: A | Discharge status: Home / Self Care | Source: Ambulatory Visit | Attending: Physical Medicine and Rehabilitation | Admitting: Physical Medicine and Rehabilitation

## 2024-02-08 DIAGNOSIS — M5442 Lumbago with sciatica, left side: Secondary | ICD-10-CM

## 2024-02-08 DIAGNOSIS — M5416 Radiculopathy, lumbar region: Secondary | ICD-10-CM

## 2024-02-08 DIAGNOSIS — M25551 Pain in right hip: Secondary | ICD-10-CM

## 2024-02-17 ENCOUNTER — Encounter: Payer: Self-pay | Admitting: Radiology

## 2024-02-26 ENCOUNTER — Ambulatory Visit: Payer: Self-pay | Admitting: Family Medicine

## 2024-03-02 ENCOUNTER — Encounter: Payer: Self-pay | Admitting: Physical Medicine and Rehabilitation

## 2024-03-02 ENCOUNTER — Ambulatory Visit: Admitting: Physical Medicine and Rehabilitation

## 2024-03-02 DIAGNOSIS — M5416 Radiculopathy, lumbar region: Secondary | ICD-10-CM

## 2024-03-02 DIAGNOSIS — M5116 Intervertebral disc disorders with radiculopathy, lumbar region: Secondary | ICD-10-CM | POA: Diagnosis not present

## 2024-03-02 NOTE — Progress Notes (Unsigned)
 Brandy Sanders - 28 y.o. female MRN [id number]  Date of birth: 1995/08/28  Office Visit Note: Visit Date: 03/02/2024 PCP: Tanda Bleacher, MD Referred by: Tanda Bleacher, MD  Subjective: Chief Complaint  Patient presents with   Lower Back - Pain   HPI: Brandy Sanders is a 28 y.o. female who comes in today for evaluation of chronic right lateral thigh pain radiating down to knee. She has history of lower back issues. She is here today for lumbar MRI review. Her pain has been more intermittent over the last few months. Her pain worsens with movement and activity, currently rates as 5 out of 10. Some relief of pain with formal physical therapy, home exercise regimen, rest and use of medications. She continues with Robaxin and Naproxen as needed. Recent lumbar MRI imaging shows interval partial involution of previously demonstrated broad-based right paracentral disc protrusion at L4-5 with less mass effect on the thecal sac and improved lateral recess narrowing bilaterally. Patient denies focal weakness, numbness and tingling. No recent trauma or falls.       Review of Systems  Musculoskeletal:  Positive for back pain.  Neurological:  Negative for tingling, sensory change, focal weakness and weakness.  All other systems reviewed and are negative.  Otherwise per HPI.  Assessment & Plan: Visit Diagnoses:    ICD-10-CM   1. Lumbar radiculopathy  M54.16     2. Intervertebral disc disorders with radiculopathy, lumbar region  M51.16        Plan: Findings:  Chronic right lateral thigh pain radiating down to knee. Patient continues to have intermittent pain. No real lower back discomfort at this time. I discussed recent lumbar MRI with her today using imaging and spine model. The previous disc herniation at the level of L4-L5 has improved from prior imaging in 2022, however does remain and is more right sided. This could be a source of pain for her. We discussed treatment plan,  including possibility of performing lumbar epidural steroid injection. She would like to hold on any interventional procedures at this time. She plans on re-grouping with chiropractic therapy and would like to continue with medications. I informed her to let us  know if she would like to try injection. She has no questions at this time. No red flag symptoms noted upon exam today.     Meds & Orders: No orders of the defined types were placed in this encounter.  No orders of the defined types were placed in this encounter.   Follow-up: Return if symptoms worsen or fail to improve.   Procedures: No procedures performed      Clinical History: CLINICAL DATA:  Chronic low back and bilateral hip pain for 2 months. No known injury or prior relevant surgery.   EXAM: MRI LUMBAR SPINE WITHOUT CONTRAST   TECHNIQUE: Multiplanar, multisequence MR imaging of the lumbar spine was performed. No intravenous contrast was administered.   COMPARISON:  Lumbar MRI 12/21/2020.  Abdominopelvic CT 01/13/2024.   FINDINGS: Segmentation: Conventional anatomy assumed, with the last open disc space designated L5-S1.Concordant with prior imaging.   Alignment:  Physiologic.   Vertebrae: No worrisome osseous lesion, acute fracture or pars defect. The visualized sacroiliac joints appear unremarkable.   Conus medullaris: Extends to the L1 level. The conus and cauda equina appear normal.   Paraspinal and other soft tissues: No significant paraspinal findings. A gallstone is noted.   Disc levels:   Sagittal images demonstrate no significant disc space findings within the visualized lower thoracic spine.  The L1-2, L2-3 and L3-4 disc space levels appear normal.   L4-5: Stable disc desiccation and bulging. Interval partial involution of previously demonstrated broad-based right paracentral disc protrusion with less mass effect on the thecal sac and improved lateral recess narrowing bilaterally. Mild  bilateral facet hypertrophy. The foramina are patent.   L5-S1: Mild disc desiccation and bulging. No disc herniation, spinal stenosis or nerve root impingement.   IMPRESSION: 1. Interval partial involution of previously demonstrated broad-based right paracentral disc protrusion at L4-5 with less mass effect on the thecal sac and improved lateral recess narrowing bilaterally. 2. Stable mild disc bulging at L5-S1 without resulting spinal stenosis or nerve root impingement. 3. No acute findings. 4. Cholelithiasis.     Electronically Signed   By: Elsie Perone M.D.   On: 02/11/2024 11:17   She reports that she has never smoked. She has never been exposed to tobacco smoke. She has never used smokeless tobacco.  Recent Labs    02/04/24 0943  HGBA1C 5.2    Objective:  VS:  HT:    WT:   BMI:     BP:   HR: bpm  TEMP: ( )  RESP:  Physical Exam Vitals and nursing note reviewed.  HENT:     Head: Normocephalic and atraumatic.     Right Ear: External ear normal.     Left Ear: External ear normal.     Nose: Nose normal.     Mouth/Throat:     Mouth: Mucous membranes are moist.  Eyes:     Extraocular Movements: Extraocular movements intact.  Cardiovascular:     Rate and Rhythm: Normal rate.     Pulses: Normal pulses.  Pulmonary:     Effort: Pulmonary effort is normal.  Abdominal:     General: Abdomen is flat. There is no distension.  Musculoskeletal:        General: Tenderness present.     Cervical back: Normal range of motion.     Comments: Patient rises from seated position to standing without difficulty. Good lumbar range of motion. No pain noted with facet loading. 5/5 strength noted with bilateral hip flexion, knee flexion/extension, ankle dorsiflexion/plantarflexion and EHL. No clonus noted bilaterally. No pain upon palpation of greater trochanters. No pain with internal/external rotation of bilateral hips. Sensation intact bilaterally. Dysesthesias noted to right L5  dermatome. Negative slump test bilaterally. Ambulates without aid, gait steady.     Skin:    General: Skin is warm and dry.     Capillary Refill: Capillary refill takes less than 2 seconds.  Neurological:     General: No focal deficit present.     Mental Status: She is alert and oriented to person, place, and time.  Psychiatric:        Mood and Affect: Mood normal.        Behavior: Behavior normal.     Ortho Exam  Imaging: No results found.  Past Medical/Family/Surgical/Social History: Medications & Allergies reviewed per EMR, new medications updated. 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   Anemia 10/01/2022   Ovarian torsion 10/15/2021   Vitamin D  deficiency 09/16/2021   Mood disorder 09/12/2021   Cholestasis of pregnancy in third trimester 06/02/2018   SVD (spontaneous vaginal delivery) 06/02/2018   Postpartum care following vaginal delivery 06/02/2018   GBS bacteriuria 12/05/2017   Seizure disorder (HCC) 10/02/2011   Past Medical History:  Diagnosis Date   Anemia  Takes Iron  supplement   Cholestasis of pregnancy in third trimester    Headache    relief with Tylenol    Ovarian torsion 10/15/2021   Seizures (HCC) 2008   Had seizure in 3rd month of first pregnancy in 2013   SVD (spontaneous vaginal delivery) 06/02/2018   Family History  Problem Relation Age of Onset   Hypertension Mother    Diabetes Maternal Grandmother    Past Surgical History:  Procedure Laterality Date   LAPAROSCOPIC SALPINGO OOPHERECTOMY Right 10/15/2021   Procedure: LAPAROSCOPIC RIGHT SALPINGO OOPHORECTOMY;  Surgeon: Danielle Rom, MD;  Location: [facility] OR;  Service: Gynecology;  Laterality: Right;   WISDOM TOOTH EXTRACTION  2014   Social History   Occupational History   Not on file  Tobacco Use   Smoking status: Never    Passive exposure: Never   Smokeless tobacco: Never  Vaping Use   Vaping status: Never Used  Substance  and Sexual Activity   Alcohol use: Yes    Comment: occ   Drug use: No   Sexual activity: Not on file

## 2024-03-02 NOTE — Progress Notes (Unsigned)
 Pain Scale   Average Pain 3 Patient advising she has chronic lower back pain radiating to right leg. Patient is here for MRI review        +Driver, -BT, -Dye Allergies.

## 2024-03-20 ENCOUNTER — Telehealth: Payer: Self-pay | Admitting: Physical Medicine and Rehabilitation

## 2024-03-20 NOTE — Telephone Encounter (Signed)
 Patient ask if she could get a disc of her MRI. RA#[phone]

## 2024-05-22 ENCOUNTER — Encounter: Payer: Self-pay | Admitting: Physical Medicine and Rehabilitation

## 2024-05-22 ENCOUNTER — Ambulatory Visit: Admitting: Physical Medicine and Rehabilitation

## 2024-05-22 DIAGNOSIS — M5416 Radiculopathy, lumbar region: Secondary | ICD-10-CM

## 2024-05-22 DIAGNOSIS — R1031 Right lower quadrant pain: Secondary | ICD-10-CM

## 2024-05-22 DIAGNOSIS — M79604 Pain in right leg: Secondary | ICD-10-CM

## 2024-05-22 DIAGNOSIS — M5116 Intervertebral disc disorders with radiculopathy, lumbar region: Secondary | ICD-10-CM

## 2024-05-22 NOTE — Progress Notes (Unsigned)
 "  Brandy Sanders - 29 y.o. female MRN [id number]  Date of birth: 06-20-1995  Office Visit Note: Visit Date: 05/22/2024 PCP: Tanda Bleacher, MD Referred by: Tanda Bleacher, MD  Subjective: Chief Complaint  Patient presents with   Right Hip - Pain   HPI: Brandy Sanders is a 29 y.o. female who comes in today for evaluation of chronic, worsening and severe right lower back pain radiating to groin, pelvis and medial aspect of right thigh. Pain ongoing intermittently over the last several months. No significant aggravating factors. She describes pain as sore and aching sensation, currently rates as 5 out of 10. Some relief of pain with home exercise regimen, rest and use of medications. Significant relief of pain with ongoing chiropractic treatments. She is being treated by Dr. Madison at [facility]. Recent lumbar MRI imaging shows interval partial involution of previously demonstrated broad-based right paracentral disc protrusion at L4-L5 with less mass effect on the thecal sac and improved lateral recess narrowing bilaterally. We discussed diagnostic lumbar epidural steroid injection during our last office visit in November, however she is very adamant about avoid interventional procedures. Patient denies focal weakness, numbness and tingling. No recent trauma or falls.      Review of Systems  Musculoskeletal:  Positive for back pain.  Neurological:  Negative for tingling, sensory change, focal weakness and weakness.  All other systems reviewed and are negative.  Otherwise per HPI.  Assessment & Plan: Visit Diagnoses:    ICD-10-CM   1. Lumbar radiculopathy  M54.16 Ambulatory referral to Orthopedic Surgery    2. Intervertebral disc disorders with radiculopathy, lumbar region  M51.16 Ambulatory referral to Orthopedic Surgery    3. Groin pain, right  R10.31 Ambulatory referral to Orthopedic Surgery    4. Pain in right leg  M79.604 Ambulatory referral to Orthopedic Surgery        Plan: Findings:  Chronic, worsening and severe right sided lower back pain radiating to groin, pelvic region and down anterior thigh to knee. Patients clinical presentation and exam are complex. No pain with internal/external rotation of right hip today. She points more to right ASIS region and pain radiates around to front of pelvic region. She also has severe pain in the L4 distribution down right medial thigh to knee. I discussed prior lumbar MRI imaging with her today. There is known central disc herniation at the level of L4-L5 that has improved from prior MRI imaging in 2022, however could continue to be source of pain. I recommended trying interlaminar epidural steroid injection at the level of L4-L5, however she is adamant about avoid interventional procedure. She is requesting to see orthopedist to discuss MRI imaging of right leg. She feels this issue is more of a right leg issue and less emanating from lumbar spine. I will place referral to my colleague Bertrum Gaskins, PA for further orthopedic evaluation. She has no questions at this time. Her exam today is non focal, good strength noted to bilateral lower extremities. No pain with internal/external rotation of right hip.     Meds & Orders: No orders of the defined types were placed in this encounter.   Orders Placed This Encounter  Procedures   Ambulatory referral to Orthopedic Surgery    Follow-up: Return if symptoms worsen or fail to improve.   Procedures: No procedures performed      Clinical History: CLINICAL DATA:  Chronic low back and bilateral hip pain for 2 months. No known injury or prior relevant surgery.   EXAM:  MRI LUMBAR SPINE WITHOUT CONTRAST   TECHNIQUE: Multiplanar, multisequence MR imaging of the lumbar spine was performed. No intravenous contrast was administered.   COMPARISON:  Lumbar MRI 12/21/2020.  Abdominopelvic CT 01/13/2024.   FINDINGS: Segmentation: Conventional anatomy assumed, with the last open  disc space designated L5-S1.Concordant with prior imaging.   Alignment:  Physiologic.   Vertebrae: No worrisome osseous lesion, acute fracture or pars defect. The visualized sacroiliac joints appear unremarkable.   Conus medullaris: Extends to the L1 level. The conus and cauda equina appear normal.   Paraspinal and other soft tissues: No significant paraspinal findings. A gallstone is noted.   Disc levels:   Sagittal images demonstrate no significant disc space findings within the visualized lower thoracic spine.   The L1-2, L2-3 and L3-4 disc space levels appear normal.   L4-5: Stable disc desiccation and bulging. Interval partial involution of previously demonstrated broad-based right paracentral disc protrusion with less mass effect on the thecal sac and improved lateral recess narrowing bilaterally. Mild bilateral facet hypertrophy. The foramina are patent.   L5-S1: Mild disc desiccation and bulging. No disc herniation, spinal stenosis or nerve root impingement.   IMPRESSION: 1. Interval partial involution of previously demonstrated broad-based right paracentral disc protrusion at L4-5 with less mass effect on the thecal sac and improved lateral recess narrowing bilaterally. 2. Stable mild disc bulging at L5-S1 without resulting spinal stenosis or nerve root impingement. 3. No acute findings. 4. Cholelithiasis.     Electronically Signed   By: Elsie Perone M.D.   On: 02/11/2024 11:17   She reports that she has never smoked. She has never been exposed to tobacco smoke. She has never used smokeless tobacco.  Recent Labs    02/04/24 0943  HGBA1C 5.2    Objective:  VS:  HT:    WT:   BMI:     BP:   HR: bpm  TEMP: ( )  RESP:  Physical Exam Vitals and nursing note reviewed.  HENT:     Head: Normocephalic and atraumatic.     Right Ear: External ear normal.     Left Ear: External ear normal.     Nose: Nose normal.     Mouth/Throat:     Mouth: Mucous  membranes are moist.  Eyes:     Extraocular Movements: Extraocular movements intact.  Cardiovascular:     Rate and Rhythm: Normal rate.     Pulses: Normal pulses.  Pulmonary:     Effort: Pulmonary effort is normal.  Abdominal:     General: Abdomen is flat. There is no distension.  Musculoskeletal:        General: Tenderness present.     Cervical back: Normal range of motion.     Comments: Patient rises from seated position to standing without difficulty. Good lumbar range of motion. No pain noted with facet loading. 5/5 strength noted with bilateral hip flexion, knee flexion/extension, ankle dorsiflexion/plantarflexion and EHL. No clonus noted bilaterally. No pain upon palpation of greater trochanters. No pain with internal/external rotation of bilateral hips. Sensation intact bilaterally. Dysesthesias noted to right L4 dermatome. Negative slump test bilaterally. Ambulates without aid, gait steady.     Skin:    General: Skin is warm and dry.     Capillary Refill: Capillary refill takes less than 2 seconds.  Neurological:     General: No focal deficit present.     Mental Status: She is alert and oriented to person, place, and time.  Psychiatric:  Mood and Affect: Mood normal.        Behavior: Behavior normal.     Ortho Exam  Imaging: No results found.  Past Medical/Family/Surgical/Social History: Medications & Allergies reviewed per EMR, new medications updated. 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   Anemia 10/01/2022   Ovarian torsion 10/15/2021   Vitamin D  deficiency 09/16/2021   Mood disorder 09/12/2021   Cholestasis of pregnancy in third trimester 06/02/2018   SVD (spontaneous vaginal delivery) 06/02/2018   Postpartum care following vaginal delivery 06/02/2018   GBS bacteriuria 12/05/2017   Seizure disorder (HCC) 10/02/2011   Past Medical History:  Diagnosis Date   Anemia    Takes Iron  supplement    Cholestasis of pregnancy in third trimester    Headache    relief with Tylenol    Ovarian torsion 10/15/2021   Seizures (HCC) 2008   Had seizure in 3rd month of first pregnancy in 2013   SVD (spontaneous vaginal delivery) 06/02/2018   Family History  Problem Relation Age of Onset   Hypertension Mother    Diabetes Maternal Grandmother    Past Surgical History:  Procedure Laterality Date   LAPAROSCOPIC SALPINGO OOPHERECTOMY Right 10/15/2021   Procedure: LAPAROSCOPIC RIGHT SALPINGO OOPHORECTOMY;  Surgeon: Danielle Rom, MD;  Location: [facility] OR;  Service: Gynecology;  Laterality: Right;   WISDOM TOOTH EXTRACTION  2014   Social History   Occupational History   Not on file  Tobacco Use   Smoking status: Never    Passive exposure: Never   Smokeless tobacco: Never  Vaping Use   Vaping status: Never Used  Substance and Sexual Activity   Alcohol use: Yes    Comment: occ   Drug use: No   Sexual activity: Not on file    "

## 2024-05-22 NOTE — Progress Notes (Unsigned)
 Pain Scale   Average Pain 5 Patient advising she has right hip pain that has increased form last visit        +Driver, -BT, -Dye Allergies.

## 2024-06-08 ENCOUNTER — Encounter: Admitting: Physician Assistant
